# Patient Record
Sex: Female | Born: 1985 | Race: White | Hispanic: No | Marital: Married | Smoking: Never smoker
Health system: Southern US, Community
[De-identification: ages and names within clinical notes are randomized; demographics above are authoritative.]

## PROBLEM LIST (undated history)

## (undated) ENCOUNTER — Inpatient Hospital Stay (HOSPITAL_COMMUNITY): Payer: Self-pay

## (undated) DIAGNOSIS — Z803 Family history of malignant neoplasm of breast: Secondary | ICD-10-CM

## (undated) DIAGNOSIS — G43009 Migraine without aura, not intractable, without status migrainosus: Secondary | ICD-10-CM

## (undated) DIAGNOSIS — J4530 Mild persistent asthma, uncomplicated: Secondary | ICD-10-CM

## (undated) DIAGNOSIS — F411 Generalized anxiety disorder: Secondary | ICD-10-CM

## (undated) HISTORY — DX: Migraine without aura, not intractable, without status migrainosus: G43.009

## (undated) HISTORY — DX: Generalized anxiety disorder: F41.1

## (undated) HISTORY — DX: Family history of malignant neoplasm of breast: Z80.3

## (undated) HISTORY — PX: WISDOM TOOTH EXTRACTION: SHX21

## (undated) HISTORY — PX: NO PAST SURGERIES: SHX2092

## (undated) HISTORY — DX: Mild persistent asthma, uncomplicated: J45.30

---
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
## 2007-06-29 ENCOUNTER — Emergency Department (HOSPITAL_COMMUNITY): Admission: EM | Admit: 2007-06-29 | Discharge: 2007-06-29 | Payer: Self-pay | Admitting: Emergency Medicine

## 2009-07-23 ENCOUNTER — Emergency Department (HOSPITAL_COMMUNITY): Admission: EM | Admit: 2009-07-23 | Discharge: 2009-07-23 | Payer: Self-pay | Admitting: Emergency Medicine

## 2009-07-23 ENCOUNTER — Emergency Department (HOSPITAL_COMMUNITY): Admission: EM | Admit: 2009-07-23 | Discharge: 2009-07-23 | Payer: Self-pay | Admitting: Family Medicine

## 2010-01-21 ENCOUNTER — Emergency Department (HOSPITAL_COMMUNITY)
Admission: EM | Admit: 2010-01-21 | Discharge: 2010-01-21 | Payer: Self-pay | Source: Home / Self Care | Admitting: Emergency Medicine

## 2010-06-14 LAB — CBC
MCHC: 32.7 g/dL (ref 30.0–36.0)
Platelets: 284 10*3/uL (ref 150–400)
RBC: 4.65 MIL/uL (ref 3.87–5.11)
RDW: 13.4 % (ref 11.5–15.5)

## 2010-06-14 LAB — POCT URINALYSIS DIP (DEVICE)
Glucose, UA: NEGATIVE mg/dL
Specific Gravity, Urine: >=1.03 (ref 1.005–1.030)
Urobilinogen, UA: 0.2 mg/dL (ref 0.0–1.0)

## 2010-06-14 LAB — DIFFERENTIAL
Basophils Absolute: 0.1 10*3/uL (ref 0.0–0.1)
Basophils Relative: 1 % (ref 0–1)
Eosinophils Absolute: 0.1 10*3/uL (ref 0.0–0.7)
Neutro Abs: 4.3 10*3/uL (ref 1.7–7.7)
Neutrophils Relative %: 58 % (ref 43–77)

## 2010-06-14 LAB — POCT PREGNANCY, URINE: Preg Test, Ur: NEGATIVE

## 2010-06-14 LAB — URINALYSIS, ROUTINE W REFLEX MICROSCOPIC
Ketones, ur: NEGATIVE mg/dL
Nitrite: NEGATIVE
Protein, ur: NEGATIVE mg/dL
Urobilinogen, UA: 0.2 mg/dL (ref 0.0–1.0)
pH: 7 (ref 5.0–8.0)

## 2010-06-14 LAB — GC/CHLAMYDIA PROBE AMP, GENITAL: Chlamydia, DNA Probe: NEGATIVE

## 2010-06-14 LAB — WET PREP, GENITAL: Yeast Wet Prep HPF POC: NONE SEEN

## 2010-08-23 ENCOUNTER — Inpatient Hospital Stay (INDEPENDENT_AMBULATORY_CARE_PROVIDER_SITE_OTHER)
Admission: RE | Admit: 2010-08-23 | Discharge: 2010-08-23 | Disposition: A | Discharge status: Home / Self Care | Payer: 59 | Source: Ambulatory Visit | Attending: Emergency Medicine | Admitting: Emergency Medicine

## 2010-08-23 ENCOUNTER — Ambulatory Visit (INDEPENDENT_AMBULATORY_CARE_PROVIDER_SITE_OTHER): Payer: 59

## 2010-08-23 DIAGNOSIS — R0609 Other forms of dyspnea: Secondary | ICD-10-CM

## 2010-08-23 DIAGNOSIS — F411 Generalized anxiety disorder: Secondary | ICD-10-CM

## 2011-07-28 ENCOUNTER — Ambulatory Visit: Payer: 59 | Admitting: Family Medicine

## 2011-07-31 ENCOUNTER — Ambulatory Visit: Payer: 59 | Admitting: Family Medicine

## 2011-11-02 ENCOUNTER — Encounter: Payer: Self-pay | Admitting: Family Medicine

## 2011-11-02 ENCOUNTER — Ambulatory Visit (INDEPENDENT_AMBULATORY_CARE_PROVIDER_SITE_OTHER): Payer: 59 | Admitting: Family Medicine

## 2011-11-02 VITALS — BP 114/76 | HR 65 | Temp 97.2°F | Ht 69.25 in | Wt 126.0 lb

## 2011-11-02 DIAGNOSIS — G2581 Restless legs syndrome: Secondary | ICD-10-CM | POA: Insufficient documentation

## 2011-11-02 DIAGNOSIS — G43009 Migraine without aura, not intractable, without status migrainosus: Secondary | ICD-10-CM

## 2011-11-02 LAB — CBC WITH DIFFERENTIAL/PLATELET
Basophils Absolute: 0.1 10*3/uL (ref 0.0–0.1)
Basophils Relative: 1 % (ref 0.0–3.0)
Eosinophils Absolute: 0.4 10*3/uL (ref 0.0–0.7)
HCT: 39.8 % (ref 36.0–46.0)
Hemoglobin: 13 g/dL (ref 12.0–15.0)
Lymphs Abs: 2 10*3/uL (ref 0.7–4.0)
MCHC: 32.6 g/dL (ref 30.0–36.0)
Monocytes Relative: 9 % (ref 3.0–12.0)
Neutro Abs: 2.8 10*3/uL (ref 1.4–7.7)
RBC: 4.48 Mil/uL (ref 3.87–5.11)
RDW: 14.3 % (ref 11.5–14.6)

## 2011-11-02 LAB — IBC PANEL: Saturation Ratios: 7.4 % — ABNORMAL LOW (ref 20.0–50.0)

## 2011-11-02 LAB — FERRITIN: Ferritin: 9.7 ng/mL — ABNORMAL LOW (ref 10.0–291.0)

## 2011-11-02 MED ORDER — CLONAZEPAM 0.5 MG PO TABS: ORAL_TABLET | ORAL | Status: DC

## 2011-11-02 MED ORDER — RIZATRIPTAN BENZOATE 10 MG PO TABS: 10.0000 mg | ORAL_TABLET | ORAL | Status: DC | PRN

## 2011-11-02 NOTE — Progress Notes (Signed)
Office Note 11/02/2011  CC:  Chief Complaint  Patient presents with  . Establish Care    bilateral leg pain x 2 weeks, knee to ankle-usually shin , but can be all over, increased walking in flip flops recently    HPI:  Janet Hall is a 26 y.o. White female who is here to establish care and discuss leg pains. Patient's most recent primary MD: has GYN at [facility], otherwise no PCP. Old records were not reviewed prior to or during today's visit.  Last couple weeks, has bilat below the knees leg discomfort, not really a pain.  Can't keep legs still when she tries to go to sleep.  Aggravating, tingly feeling in LE's, causes probs with sleep initiation.  This prob has not bothered her before. No recent injury or overactivity.  No change in appearance of legs. Describes her menses as being very heavy: 3 pad or tampons per day x 5d, +clots.  This has worsened over the last few years.  Cycles are regular, q28d, and she is currently menstruating now--no missed periods. Denies hx of anemia.  We discussed her HA's today as well, says they do bother her almost daily, but only has a severe HA with nausea and photophobia anywhere from 1-4 times per month over the last few years.  She denies prodromal sx's or aura-like sx's.  She has tried excedrin migraine but nothing else, has never seen an MD for this.  She ends up having to lay down in a dark room and sleep for a while to get it to go away. She has been trying to cut back on caffeine lately and thinks this may be impacting her HA's lately, but otherwise she identifies no triggers, althought she admits to being a chronic worrier--"I stress and worry about EVERYTHING".  Denies depressed mood.  No hx of head trauma.   Past Medical History  Diagnosis Date  . Migraine without aura     Onset in early teens.  Anywhere from 2 per week to 1 per month.     PSH: none History reviewed. No pertinent past surgical history.  Family History  Problem  Relation Age of Onset  . Arthritis Mother   . Cancer Mother 64    breast  . Hypertension Father   . Arthritis Maternal Grandmother     liver cancer  . Arthritis Maternal Grandfather     stomach cancer    History   Social History  . Marital Status: Single    Spouse Name: N/A    Number of Children: N/A  . Years of Education: N/A   Occupational History  . Not on file.   Social History Main Topics  . Smoking status: Never Smoker   . Smokeless tobacco: Never Used  . Alcohol Use: No  . Drug Use: No  . Sexually Active: Yes   Other Topics Concern  . 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  No T/A/Ds.Intermittent exercising.   MEDS: none  No Known Allergies  ROS Review of Systems  Constitutional: Negative for fever and fatigue.  HENT: Negative for congestion and sore throat.   Eyes: Negative for visual disturbance.  Respiratory: Negative for cough.   Cardiovascular: Negative for chest pain.  Gastrointestinal: Negative for nausea and abdominal pain.  Genitourinary: Negative for dysuria. Menstrual problem: as per hpi.  Musculoskeletal: Negative for myalgias, back pain, joint swelling and arthralgias.  Skin: Negative for color change, pallor and rash.  Neurological: Negative for weakness and headaches.  Hematological: Negative for adenopathy. Does not bruise/bleed easily.    PE; Blood pressure 114/76, pulse 65, temperature 97.2 F (36.2 C), temperature source Temporal, height 5' 9.25" (1.759 m), weight 126 lb (57.153 kg), last menstrual period 10/08/2011, SpO2 100.00%. Gen: Alert, well appearing.  Patient is oriented to person, place, time, and situation. AFFECT: pleasant, lucid thought and speech. ENT: Eyes: no injection, icteris, swelling, or exudate.  EOMI, PERRLA. Nose: no drainage or turbinate edema/swelling.  No injection or focal lesion.  Mouth: lips without  lesion/swelling.  Oral mucosa pink and moist.  Dentition intact and without obvious caries or gingival swelling.  Oropharynx without erythema, exudate, or swelling.  Neck - No masses or thyromegaly or limitation in range of motion CV: RRR, no m/r/g.   LUNGS: CTA bilat, nonlabored resps, good aeration in all lung fields. EXT: no clubbing, cyanosis, or edema.  Lower legs without tenderness, bruising, hypesthesia, or color change.  DP and PT pulses 2+ bilat. Neuro: CN 2-12 intact bilaterally, strength 5/5 in proximal and distal upper extremities and lower extremities bilaterally.  No sensory deficits.  No tremor.  No ataxia.  Upper extremity and lower extremity DTRs symmetric.   Pertinent labs:  None today  ASSESSMENT AND PLAN:   New pt: obtain old records (GYN).  Restless legs syndrome Describes classic RLS sx's. Will check for iron def--CBC, IBC panel, ferritin today. Discussed med options,decided on trial of clonazepam 0.5mg , 1-2 qhs.  Therapeutic expectations and side effect profile of medication discussed today.  Patient's questions answered. F/u 2-3 wks.  Migraine headache without aura Has never tried migraine-specific abortive med or prophylactic migraine med. I rx'd maxalt generic 10mg  for her today to try prn onset of HA.  Therapeutic expectations and side effect profile of medication discussed today.  Patient's questions answered. She admits to being chronically stressed/big worrier about everything, and this is likely her main trigger for HA's. We hopefully will get to discuss mgmt of this at next f/u.    Return for f/u 2-3 wks to recheck RLS, migraine, discuss anxiety.

## 2011-11-02 NOTE — Assessment & Plan Note (Signed)
Has never tried migraine-specific abortive med or prophylactic migraine med. I rx'd maxalt generic 10mg  for her today to try prn onset of HA.  Therapeutic expectations and side effect profile of medication discussed today.  Patient's questions answered. She admits to being chronically stressed/big worrier about everything, and this is likely her main trigger for HA's. We hopefully will get to discuss mgmt of this at next f/u.

## 2011-11-02 NOTE — Assessment & Plan Note (Signed)
Describes classic RLS sx's. Will check for iron def--CBC, IBC panel, ferritin today. Discussed med options,decided on trial of clonazepam 0.5mg , 1-2 qhs.  Therapeutic expectations and side effect profile of medication discussed today.  Patient's questions answered. F/u 2-3 wks.

## 2011-11-16 ENCOUNTER — Telehealth: Payer: Self-pay | Admitting: Family Medicine

## 2011-11-16 NOTE — Telephone Encounter (Signed)
Pt states that she has been urinating the bed since she has been on the Clonazepam for RLS. Wants to know if this is a side effect is this normal or does a new script need to be phoned in? Pt uses [facility].

## 2011-11-16 NOTE — Telephone Encounter (Signed)
The dose of clonazepam may be a little too high, although this is a very low dose.  Please see if she is taking 1 tab or two tabs and also see if this med is helping her RLS symptoms or not.  With this info I can then decide what to do next--thx

## 2011-11-17 MED ORDER — ROPINIROLE HCL 0.25 MG PO TABS: ORAL_TABLET | ORAL | Status: DC

## 2011-11-17 NOTE — Telephone Encounter (Signed)
Pt notified.  She will call me back to let me know what pharmacy to send to as [facility] pharmacy will be closed tomorrow.

## 2011-11-17 NOTE — Telephone Encounter (Signed)
Pt is taking one tablet daily and it is helping her RLS symptoms.  She also states she began having crazy, scary dreams after beginning med.  She has also noticed an increase in her HA's since starting this med.  She is having a HA almost everyday.

## 2011-11-17 NOTE — Telephone Encounter (Signed)
RX faxed to [facility] per pt request.

## 2011-11-17 NOTE — Telephone Encounter (Signed)
Stop clonazepam. I sent in rx for ropinirole: tell her to follow the instructions on the bottle.  Remind her that she should be taking her iron tab and that when her iron levels are back to normal she should be able to come off of the RLS meds.--thx

## 2011-11-17 NOTE — Telephone Encounter (Signed)
Janet Hall- this is the patient that called yesterday. Not sure if you called her or not. I am forwarding this to you.

## 2011-11-21 ENCOUNTER — Ambulatory Visit (INDEPENDENT_AMBULATORY_CARE_PROVIDER_SITE_OTHER): Payer: 59 | Admitting: Family Medicine

## 2011-11-21 ENCOUNTER — Encounter: Payer: Self-pay | Admitting: Family Medicine

## 2011-11-21 VITALS — BP 108/73 | HR 58 | Ht 69.25 in | Wt 125.0 lb

## 2011-11-21 DIAGNOSIS — G2581 Restless legs syndrome: Secondary | ICD-10-CM

## 2011-11-21 DIAGNOSIS — Z124 Encounter for screening for malignant neoplasm of cervix: Secondary | ICD-10-CM | POA: Insufficient documentation

## 2011-11-21 DIAGNOSIS — F411 Generalized anxiety disorder: Secondary | ICD-10-CM

## 2011-11-21 DIAGNOSIS — G43009 Migraine without aura, not intractable, without status migrainosus: Secondary | ICD-10-CM

## 2011-11-21 HISTORY — DX: Generalized anxiety disorder: F41.1

## 2011-11-21 NOTE — Progress Notes (Signed)
OFFICE NOTE  11/21/2011  CC:  Chief Complaint  Patient presents with  . Follow-up    migraines-better off clonazepam; RLS-change to Requip on Sunday, doing OK     HPI: Patient is a 26 y.o. Caucasian female who is here for 3 wk f/u RLS, migraines, anxiety. Taking iron bid, tolerating it well. CLonazepam didn't help RLS + it oversedated her and made her HA's more frequent. Maxalt trial unhelpful--she says Goodies powders work every time for her when she has a HA. Worry, chronic anxiety was re-addressed today---she is not ready to start daily anxiety med at this time. Denies depression.  One time in distant past she went to ED for breathing issues and was eventually told she had anxiety problems, was given a few pills of something she doesn't recall the name of but says it was not helpful.  She is not interested in any med for anxiety that is potentially addictive/habit forming. She has never been on an SSRI.   Pertinent PMH:  Past Medical History  Diagnosis Date  . Migraine without aura     Onset in early teens.  Anywhere from 2 per week to 1 per month.      MEDS:  Outpatient Prescriptions Prior to Visit  Medication Sig Dispense Refill  . rizatriptan (MAXALT) 10 MG tablet Take 1 tablet (10 mg total) by mouth as needed for migraine. May repeat in 2 hours if needed (max in 24 h is 20 mg)  9 tablet  0  . rOPINIRole (REQUIP) 0.25 MG tablet 1 tab po 2 hours prior to sleep.  May increase by 1 tab every 5 days to maximum of 6 tabs po 2 hours prior to sleep to get desired control of restless legs syndrome  60 tablet  1    PE: Blood pressure 108/73, pulse 58, height 5' 9.25" (1.759 m), weight 125 lb (56.7 kg), last menstrual period 10/31/2011. Gen: Alert, well appearing.  Patient is oriented to person, place, time, and situation. No further exam today.  IMPRESSION AND PLAN:  Restless legs syndrome Improving. Continue ropinirole, titrate dose as directed prn. Continue iron  replacement (325mg  bid). Recheck iron levels in 5 wks (total of 2 mo on iron).  Migraine headache without aura Failed maxalt trial. She does fine with goodies powder and uses this only 1-4 times per month. Worry may be her primary trigger but she declined trial of SSRI today.  GAD (generalized anxiety disorder) Pt declined trial of SSRI.      FOLLOW UP: 5 wks

## 2011-11-21 NOTE — Assessment & Plan Note (Signed)
Improving. Continue ropinirole, titrate dose as directed prn. Continue iron replacement (325mg  bid). Recheck iron levels in 5 wks (total of 2 mo on iron).

## 2011-11-21 NOTE — Assessment & Plan Note (Signed)
Pt declined trial of SSRI.

## 2011-11-21 NOTE — Assessment & Plan Note (Signed)
Failed maxalt trial. She does fine with goodies powder and uses this only 1-4 times per month. Worry may be her primary trigger but she declined trial of SSRI today.

## 2011-12-27 ENCOUNTER — Ambulatory Visit (INDEPENDENT_AMBULATORY_CARE_PROVIDER_SITE_OTHER): Payer: 59 | Admitting: Family Medicine

## 2011-12-27 ENCOUNTER — Encounter: Payer: Self-pay | Admitting: Family Medicine

## 2011-12-27 VITALS — BP 132/83 | HR 80 | Ht 69.25 in | Wt 126.0 lb

## 2011-12-27 DIAGNOSIS — G2581 Restless legs syndrome: Secondary | ICD-10-CM

## 2011-12-27 DIAGNOSIS — S838X9A Sprain of other specified parts of unspecified knee, initial encounter: Secondary | ICD-10-CM

## 2011-12-27 DIAGNOSIS — E611 Iron deficiency: Secondary | ICD-10-CM

## 2011-12-27 DIAGNOSIS — S76119A Strain of unspecified quadriceps muscle, fascia and tendon, initial encounter: Secondary | ICD-10-CM

## 2011-12-27 LAB — IBC PANEL
Saturation Ratios: 45.1 % (ref 20.0–50.0)
Transferrin: 243.7 mg/dL (ref 212.0–360.0)

## 2011-12-27 MED ORDER — ROPINIROLE HCL 0.5 MG PO TABS: ORAL_TABLET | ORAL | Status: DC

## 2011-12-27 NOTE — Assessment & Plan Note (Signed)
Right quad strain, improving slowly with rest. Recommended heat qd-bid. ROM/stretching/strengthening. Thigh sleeve fitted today, given to pt in office to take home and wear daily until minimal sx's. She declines formal PT referral at this time.

## 2011-12-27 NOTE — Assessment & Plan Note (Signed)
Improving, but still needs ropinirole titrated up. Increase to 0.5mg  qhs dosing x 2 wks, then increase to 1mg  qhs dosing after that if still not well controlled. Recheck IBC and ferritin today. F/u in office in 45mo.  I am hoping that after her iron has been normal for a couple of months then she can be weened off the ropinirole.

## 2011-12-27 NOTE — Patient Instructions (Addendum)
Quadriceps Strain  with Rehab  A strain is a tear in a muscle or the tendon that attaches the muscle to bone. A quadriceps strain is a tear in the muscles on the front of the thigh (quadriceps muscles) or their tendons. The quadriceps muscles are important for straightening the knee and bending the hip. The condition is characterized by pain, inflammation, and reduced function of these muscles. Strains are classified into three categories. Grade 1 strains cause pain, but the tendon is not lengthened. Grade 2 strains include a lengthened ligament due to the ligament being stretched or partially ruptured. With grade 2 strains there is still function, although the function may be diminished. Grade 3 strains are characterized by a complete tear of the tendon or muscle, and function is usually impaired.   SYMPTOMS   · Pain, tenderness, inflammation, and/or bruising (contusion) over the quadriceps muscles  · Pain that worsens with use of the quadriceps muscles.  · Muscle spasm in the thigh.  · Difficulty with common tasks that involve the quadriceps muscle, such as walking.  · A crackling sound (crepitation) when the tendon is moved or touched.  · Loss of fullness of the muscle or bulging within the area of muscle with complete rupture.  CAUSES   A strain occurs when a force is placed on the muscle or tendon that is greater than it can withstand. Common mechanisms of injury include:  · Repetitive strenuous use of the quadriceps muscles. This may be due to an increase in the intensity, frequency, or duration of exercise.  · Direct trauma to the quadriceps muscles or tendons.  RISK INCREASES WITH:  · Activities that involve forceful contractions of the quadriceps muscles (jumping or sprinting).  · Contact sports (soccer or football).  · Poor strength and flexibility.  · Failure to warm-up properly before activity.  · Previous injury to the thigh or knee.  PREVENTION  · Warm up and stretch properly before activity.  · Allow  for adequate recovery between workouts.  · Maintain physical fitness:  · Strength, flexibility, and endurance.  · Cardiovascular fitness.  · Wear properly fitted and padded protective equipment.  PROGNOSIS   If treated properly, then quadriceps muscles strains are usually curable within 6 weeks.   RELATED COMPLICATIONS   · Prolonged healing time, if improperly treated or re-injured.  · Recurrent symptoms that result in a chronic problem.  · Recurrence of symptoms if activity is resumed too soon.  TREATMENT   Treatment initially involves the use of ice and medication to help reduce pain and inflammation. The use of strengthening and stretching exercises may help reduce pain with activity. These exercises may be performed at home or with referral to a therapist. Crutches may be recommended to allow the muscle to rest until walking can be completed without limping. Surgery is rarely necessary for this injury, but may be considered if the injury involves a grade 3 strain, or if symptoms persist for greater than 3 months despite non-surgical (conservative) treatment.   MEDICATION  · If pain medication is necessary, then nonsteroidal anti-inflammatory medications, such as aspirin and ibuprofen, or other minor pain relievers, such as acetaminophen, are often recommended.  · Do not take pain medication for 7 days before surgery.  · Prescription pain relievers may be given if deemed necessary by your caregiver. Use only as directed and only as much as you need.  · Ointments applied to the skin may be helpful.  · Corticosteroid injections may be given   by your caregiver. These injections should be reserved for the most serious cases, because they may only be given a certain number of times.  HEAT AND COLD  · Cold treatment (icing) relieves pain and reduces inflammation. Cold treatment should be applied for 10 to 15 minutes every 2 to 3 hours for inflammation and pain and immediately after any activity that aggravates your  symptoms. Use ice packs or massage the area with a piece of ice (ice massage).  · Heat treatment may be used prior to performing the stretching and strengthening activities prescribed by your caregiver, physical therapist, or athletic trainer. Use a heat pack or soak the injury in warm water.  SEEK MEDICAL CARE IF:  · Treatment seems to offer no benefit, or the condition worsens.  · Any medications produce adverse side effects.  EXERCISES   RANGE OF MOTION (ROM) AND STRETCHING EXERCISES - Quadriceps Strain  These exercises may help you when beginning to rehabilitate your injury. Your symptoms may resolve with or without further involvement from your physician, physical therapist or athletic trainer. While completing these exercises, remember:   · Restoring tissue flexibility helps normal motion to return to the joints. This allows healthier, less painful movement and activity.  · An effective stretch should be held for at least 30 seconds.  · A stretch should never be painful. You should only feel a gentle lengthening or release in the stretched tissue.  RANGE OF MOTION - Knee Flexion, Active  · Lie on your back with both knees straight. (If this causes back discomfort, bend your opposite knee, placing your foot flat on the floor.)  · Slowly slide your heel back toward your buttocks until you feel a gentle stretch in the front of your knee or thigh.  · Hold for __________ seconds. Slowly slide your heel back to the starting position.  Repeat __________ times. Complete this exercise __________ times per day.   STRETCH - Quadriceps, Prone  · Lie on your stomach on a firm surface, such as a bed or padded floor.  · Bend your right / left knee and grasp your ankle. If you are unable to reach, your ankle or pant leg, use a belt around your foot to lengthen your reach.  · Gently pull your heel toward your buttocks. Your knee should not slide out to the side. You should feel a stretch in the front of your thigh and/or  knee.  · Hold this position for __________ seconds.  Repeat __________ times. Complete this stretch __________ times per day.   STRETCHING - Hip Flexors, Lunge  · Half kneel with your right / left knee on the floor and your opposite knee bent and directly over your ankle.  · Keep good posture with your head over your shoulders. Tighten your buttocks to point your tailbone downward; this will prevent your back from arching too much.  · You should feel a gentle stretch in the front of your thigh and/or hip. If you do not feel any resistance, slightly slide your opposite foot forward and then slowly lunge forward so your knee once again lines up over your ankle. Be sure your tailbone remains pointed downward.  · Hold this stretch for __________ seconds.  Repeat __________ times. Complete this stretch __________ times per day.  STRENGTHENING EXERCISES - Quadriceps Strain  These exercises may help you when beginning to rehabilitate your injury. They may resolve your symptoms with or without further involvement from your physician, physical therapist or athletic trainer.   While completing these exercises, remember:   · Muscles can gain both the endurance and the strength needed for everyday activities through controlled exercises.  · Complete these exercises as instructed by your physician, physical therapist or athletic trainer. Progress the resistance and repetitions only as guided.  STRENGTH - Quadriceps, Isometrics  · Lie on your back with your right / left leg extended and your opposite knee bent.  · Gradually tense the muscles in the front of your right / left thigh. You should see either your knee cap slide up toward your hip or increased dimpling just above the knee. This motion will push the back of the knee down toward the floor/mat/bed on which you are lying.  · Hold the muscle as tight as you can without increasing your pain for __________ seconds.  · Relax the muscles slowly and completely in between each  repetition.  Repeat __________ times. Complete this exercise __________ times per day.   STRENGTH - Quadriceps, Short Arcs   · Lie on your back. Place a __________ inch towel roll under your knee so that the knee slightly bends.  · Raise only your lower leg by tightening the muscles in the front of your thigh. Do not allow your thigh to rise.  · Hold this position for __________ seconds.  Repeat __________ times. Complete this exercise __________ times per day.   OPTIONAL ANKLE WEIGHTS: Begin with ____________________, but DO NOT exceed ____________________. Increase in1 lb/0.5 kg increments.  STRENGTH - Quadriceps, Straight Leg Raises   Quality counts! Watch for signs that the quadriceps muscle is working to insure you are strengthening the correct muscles and not "cheating" by substituting with healthier muscles.  · Lay on your back with your right / left leg extended and your opposite knee bent.  · Tense the muscles in the front of your right / left thigh. You should see either your knee cap slide up or increased dimpling just above the knee. Your thigh may even quiver.  · Tighten these muscles even more and raise your leg 4 to 6 inches off the floor. Hold for __________ seconds.  · Keeping these muscles tense, lower your leg.  · Relax the muscles slowly and completely in between each repetition.  Repeat __________ times. Complete this exercise __________ times per day.   STRENGTH - Quadriceps, Wall Slides   Follow guidelines for form closely. Increased knee pain often results from poorly placed feet or knees.  · Lean against a smooth wall or door and walk your feet out 18-24 inches. Place your feet hip-width apart.  · Slowly slide down the wall or door until your knees bend __________ degrees.* Keep your knees over your heels, not your toes, and in line with your hips, not falling to either side.  · Hold for __________ seconds. Stand up to rest for __________ seconds in between each repetition.  Repeat  __________ times. Complete this exercise __________ times per day.  * Your physician, physical therapist or athletic trainer will alter this angle based on your symptoms and progress.  STRENGTH - Quadriceps, Step-Ups   · Use a thick book, step or step stool that is __________ inches tall.  · Holding a wall or counter for balance only, not support.  · Slowly step-up with your right / left foot, keeping your knee in line with your hip and foot. Do not allow your knee to bend so far that you cannot see your toes.  · Slowly unlock your knee and

## 2011-12-27 NOTE — Progress Notes (Signed)
OFFICE NOTE  12/27/2011  CC:  Chief Complaint  Patient presents with  . Follow-up    RLS-better on Requip, sleeping better; anemia; anxiety     HPI: Patient is a 26 y.o. Caucasian female who is here for 1 mo f/u iron deficiency without anemia and associated RLS. She has been on ropinirole for her RLS and was finding it more helpful than the clonazepam I had tried initially. Says 3 wks ago she strained her right quad muscle when running playing softball. It is improving some but she cannot run still w/out it hurting and impairing running. She takes no med for this.  It doesn't bother her at rest or when walking, just when running.  No back pain.  No paresthesias or LE weakness.   Pertinent PMH:  Past Medical History  Diagnosis Date  . Migraine without aura     Onset in early teens.  Anywhere from 2 per week to 1 per month.    . Migraine headache without aura 11/02/2011  . GAD (generalized anxiety disorder) 11/21/2011    MEDS:  Ropinirole 0.25mg  qhs, ferrous sulfate 325mg  bid  PE: Blood pressure 132/83, pulse 80, height 5' 9.25" (1.759 m), weight 126 lb (57.153 kg). Gen: Alert, well appearing.  Patient is oriented to person, place, time, and situation. AFFECT: pleasant, lucid thought and speech. Right thigh without TTP.  Hip flexion causes pain in right mid thigh.  Right lower leg extension causes no thigh pain.     IMPRESSION AND PLAN:  Restless legs syndrome Improving, but still needs ropinirole titrated up. Increase to 0.5mg  qhs dosing x 2 wks, then increase to 1mg  qhs dosing after that if still not well controlled. Recheck IBC and ferritin today. F/u in office in 7mo.  I am hoping that after her iron has been normal for a couple of months then she can be weened off the ropinirole.  Strain of quadriceps muscle Right quad strain, improving slowly with rest. Recommended heat qd-bid. ROM/stretching/strengthening. Thigh sleeve fitted today, given to pt in office to take  home and wear daily until minimal sx's. She declines formal PT referral at this time.     FOLLOW UP: 2 mo

## 2011-12-28 LAB — FERRITIN: Ferritin: 38.9 ng/mL (ref 10.0–291.0)

## 2012-01-12 ENCOUNTER — Ambulatory Visit (INDEPENDENT_AMBULATORY_CARE_PROVIDER_SITE_OTHER): Payer: 59 | Admitting: Family Medicine

## 2012-01-12 ENCOUNTER — Encounter: Payer: Self-pay | Admitting: Family Medicine

## 2012-01-12 VITALS — BP 106/74 | HR 62 | Temp 97.4°F | Ht 69.25 in | Wt 125.0 lb

## 2012-01-12 DIAGNOSIS — R071 Chest pain on breathing: Secondary | ICD-10-CM

## 2012-01-12 DIAGNOSIS — R079 Chest pain, unspecified: Secondary | ICD-10-CM

## 2012-01-12 DIAGNOSIS — R0789 Other chest pain: Secondary | ICD-10-CM

## 2012-01-12 DIAGNOSIS — R0602 Shortness of breath: Secondary | ICD-10-CM

## 2012-01-12 LAB — BASIC METABOLIC PANEL
BUN: 8 mg/dL (ref 6–23)
Chloride: 102 mEq/L (ref 96–112)
Creatinine, Ser: 0.8 mg/dL (ref 0.4–1.2)
GFR: 99.8 mL/min (ref >60.00)

## 2012-01-12 LAB — D-DIMER, QUANTITATIVE: D-Dimer, Quant: 0.27 ug/mL-FEU (ref 0.00–0.48)

## 2012-01-12 NOTE — Progress Notes (Signed)
OFFICE NOTE  01/12/2012  CC:  Chief Complaint  Patient presents with  . Shoulder Pain    left, feels like collar bone is sticking out more than right side, plays softball and throws left-handed; also having difficulty taking a deep breath, ? asthma  . Flu Vaccine    declined     HPI: Patient is a 26 y.o. Caucasian female who is here for several complaints.   Describes aggravating feeling in left upper chest region for the last couple of weeks.  Plays softball and is left handed, thinks clavicle is poking out on left.   Pain not really present upon awakening but notes it more as day goes on.   No hx of injury to shoulder/clavicle/chest wall. Appetite down.  Feels like breathing is different, has to take deep breaths more frequently.  No wheezing or rapid breathing.  No fevers.  NO leg swelling or pain.   No prolonged immobility, no FH of blood clots. Admits to a lot of belching lately, but denies GERD.  Pertinent PMH:  Past Medical History  Diagnosis Date  . Migraine without aura     Onset in early teens.  Anywhere from 2 per week to 1 per month.    . Migraine headache without aura 11/02/2011  . GAD (generalized anxiety disorder) 11/21/2011    MEDS:  Outpatient Prescriptions Prior to Visit  Medication Sig Dispense Refill  . ferrous sulfate 325 (65 FE) MG EC tablet Take 325 mg by mouth 2 (two) times daily.      Marland Kitchen rOPINIRole (REQUIP) 0.5 MG tablet 1 tab po 2 hours prior to bedtime.  After 1 week, you may take 2 tabs 2 hours prior to bedtime if you don't feel like your RLS is improved.  30 tablet  1    PE: Blood pressure 106/74, pulse 62, temperature 97.4 F (36.3 C), temperature source Temporal, height 5' 9.25" (1.759 m), weight 125 lb (56.7 kg), SpO2 97.00%. Examined pt with CMA Francee Piccolo in room as chaperone. Gen: Alert, well appearing.  Patient is oriented to person, place, time, and situation. AFFECT: pleasant, lucid thought and speech.   ENT:   Eyes: no injection,  icteris, swelling, or exudate.  EOMI, PERRLA. Nose: no drainage or turbinate edema/swelling.  No injection or focal lesion.  Mouth: lips without lesion/swelling.  Oral mucosa pink and moist.  Dentition intact and without obvious caries or gingival swelling.  Oropharynx without erythema, exudate, or swelling.  Neck - No masses or thyromegaly or limitation in range of motion CV: RRR, no m/r/g.   LUNGS: CTA bilat, nonlabored resps, good aeration in all lung fields. EXT: no clubbing, cyanosis, or edema.  Musc: clavicles symmetric, normal appearing, nontender.  Mild soreness to palpation of pectoralis mm's just below left clavicle diffusely.  No supraclavicular or axillary LAD.  IMPRESSION AND PLAN:  Left-sided chest wall pain I think she has upper pectoralis muscle strain/pain secondary to softball--throws left-handed. Her breathing complaint is highly suggestive of anxiety/stress.   I think she is low risk for PE but will check D-dimer stat.  If neg, no further w/u for this.  If positive, will pursue chest CT.    An After Visit Summary was printed and given to the patient.  FOLLOW UP: prn

## 2012-01-12 NOTE — Assessment & Plan Note (Addendum)
I think she has upper pectoralis muscle strain/pain secondary to softball--throws left-handed. Her breathing complaint is highly suggestive of anxiety/stress.   I think she is low risk for PE but will check D-dimer stat.  If neg, no further w/u for this.  If positive, will pursue chest CT.

## 2012-02-26 ENCOUNTER — Ambulatory Visit: Payer: 59 | Admitting: Family Medicine

## 2012-03-14 ENCOUNTER — Ambulatory Visit (INDEPENDENT_AMBULATORY_CARE_PROVIDER_SITE_OTHER): Payer: 59 | Admitting: Family Medicine

## 2012-03-14 ENCOUNTER — Encounter: Payer: Self-pay | Admitting: Family Medicine

## 2012-03-14 VITALS — BP 128/83 | HR 76 | Temp 98.4°F | Ht 69.25 in | Wt 126.0 lb

## 2012-03-14 DIAGNOSIS — F411 Generalized anxiety disorder: Secondary | ICD-10-CM

## 2012-03-14 DIAGNOSIS — G2581 Restless legs syndrome: Secondary | ICD-10-CM

## 2012-03-14 DIAGNOSIS — R079 Chest pain, unspecified: Secondary | ICD-10-CM

## 2012-03-14 MED ORDER — CITALOPRAM HYDROBROMIDE 20 MG PO TABS: 20.0000 mg | ORAL_TABLET | Freq: Every day | ORAL | Status: DC

## 2012-03-14 NOTE — Patient Instructions (Signed)
Ibuprofen: 3 of the 200mg  OTC tabs each dose.  Dose this morning, mid afternoon, and late evening.  Take with snack or food in stomach.

## 2012-03-14 NOTE — Progress Notes (Signed)
OFFICE NOTE  03/14/2012  CC:  Chief Complaint  Patient presents with  . Chest Pain    X 2-3 days- mom just found out her breast cancer returned and its stage 4     HPI: Patient is a 26 y.o. Caucasian female who is here for chest pains.  Onset about 3d/a. Sharp pain focally in middle of sternum, tender when she touches it, it comes and goes.  No known trigger. No alleviating factors.  No diaphoresis, no nausea, no SOB, no palpitations. She is taking her iron bid during menses and only occasionally when not menstruating--has trouble remembering to take it.  No recent acute viral illnesses Her RLS is gone since taking iron.  . She no longer works at [employer] and body works b/c her hours were cut back, so she is currently looking for another job.  She recently has been dealing with the diagnosis of stage 4 breast cancer in her mother.  Pertinent PMH:  Past Medical History  Diagnosis Date  . Migraine without aura     Onset in early teens.  Anywhere from 2 per week to 1 per month.    . Migraine headache without aura 11/02/2011  . GAD (generalized anxiety disorder) 11/21/2011   Past surgical, social, and family history reviewed and no changes noted since last office visit.  MEDS:  Outpatient Prescriptions Prior to Visit  Medication Sig Dispense Refill  . ferrous sulfate 325 (65 FE) MG EC tablet Take 325 mg by mouth 2 (two) times daily.      . [DISCONTINUED] rOPINIRole (REQUIP) 0.5 MG tablet 1 tab po 2 hours prior to bedtime.  After 1 week, you may take 2 tabs 2 hours prior to bedtime if you don't feel like your RLS is improved.  30 tablet  1   Last reviewed on 03/14/2012  1:39 PM by Jeoffrey Massed, MD  PE:  PT examined with CMA Francee Piccolo present. Blood pressure 128/83, pulse 76, temperature 98.4 F (36.9 C), temperature source Temporal, height 5' 9.25" (1.759 m), weight 126 lb (57.153 kg), last menstrual period 02/28/2012, SpO2 98.00%. Gen: Alert, well appearing.  Patient is  oriented to person, place, time, and situation. Affect: pleasant, slightly anxious. ENT:  Eyes: no injection, icteris, swelling, or exudate.  EOMI, PERRLA. Nose: no drainage or turbinate edema/swelling.  No injection or focal lesion.  Mouth: lips without lesion/swelling.  Oral mucosa pink and moist.  Dentition intact and without obvious caries or gingival swelling.  Oropharynx without erythema, exudate, or swelling.  Neck - No masses or thyromegaly or limitation in range of motion CV: RRR, no m/r/g.   LUNGS: CTA bilat, nonlabored resps, good aeration in all lung fields. Chest: focal TTP in a quarter -sized area on the mid sternal region.  No mass, no erythema.  No costochondral tenderness.    LABS: none  IMPRESSION AND PLAN:  Central chest pain No sign of cardiopulmonary etiology. Suspect musculoskeletal pain + stress/anxiety.   She does admit to a moderate amount of hypochondriac tendencies. Reassured pt, recommended ibuprofen 600mg  tid with food for 2 wks, then use prn.  GAD (generalized anxiety disorder) She decided to do a trial of SSRI today: started citalopram 20mg  qd.  Therapeutic expectations and side effect profile of medication discussed today.  Patient's questions answered. She is very hesitant to take any benzos.  Restless legs syndrome Resolved with iron replacement. Continue FeSO4 325 bid during menses, and daily during other times of the month.   An  After Visit Summary was printed and given to the patient.  Will check CBC and iron studies at this visit.  FOLLOW UP: 1 month

## 2012-03-15 DIAGNOSIS — R079 Chest pain, unspecified: Secondary | ICD-10-CM | POA: Insufficient documentation

## 2012-03-15 NOTE — Assessment & Plan Note (Signed)
Resolved with iron replacement. Continue FeSO4 325 bid during menses, and daily during other times of the month.

## 2012-03-15 NOTE — Assessment & Plan Note (Signed)
No sign of cardiopulmonary etiology. Suspect musculoskeletal pain + stress/anxiety.   She does admit to a moderate amount of hypochondriac tendencies. Reassured pt, recommended ibuprofen 600mg  tid with food for 2 wks, then use prn.

## 2012-03-15 NOTE — Assessment & Plan Note (Signed)
She decided to do a trial of SSRI today: started citalopram 20mg  qd.  Therapeutic expectations and side effect profile of medication discussed today.  Patient's questions answered. She is very hesitant to take any benzos.

## 2012-04-18 ENCOUNTER — Encounter: Payer: Self-pay | Admitting: Family Medicine

## 2012-04-18 ENCOUNTER — Ambulatory Visit (INDEPENDENT_AMBULATORY_CARE_PROVIDER_SITE_OTHER): Payer: 59 | Admitting: Family Medicine

## 2012-04-18 VITALS — BP 118/83 | HR 68 | Ht 69.25 in | Wt 126.0 lb

## 2012-04-18 DIAGNOSIS — F411 Generalized anxiety disorder: Secondary | ICD-10-CM

## 2012-04-18 DIAGNOSIS — R202 Paresthesia of skin: Secondary | ICD-10-CM | POA: Insufficient documentation

## 2012-04-18 DIAGNOSIS — R209 Unspecified disturbances of skin sensation: Secondary | ICD-10-CM

## 2012-04-18 NOTE — Assessment & Plan Note (Signed)
Encouraged pt to START CITALOPRAM as planned last visit.

## 2012-04-18 NOTE — Progress Notes (Signed)
OFFICE NOTE  04/18/2012  CC:  Chief Complaint  Patient presents with  . Follow-up    anxiety     HPI: Patient is a 27 y.o. Caucasian female who is here for f/u GAD and also having some left shoulder and arm pain.  Didn't start the citalopram I rx'd last time b/c "too busy"--her mom has stage 4 breast cancer and she has been going with her to lots of appointments.  She states her generalized anxiety is not as bad as it was a month ago but is still prominent.    Onset 1 wk ago: left shoulder pain and goes down left arm, also some tingling sensation.  Coming and going initially, was worse in middle of night or morning, lately hurting more constantly.  Intensity 5-6/10, but when asked about intensity she stated the sensation was hard to characterize and was not really a bad pain but just an uncomfortable feeling in the shoulder and arm.  She can't recall any repetitive motion or strain to bring this on.  Mild left sided neck pain.  No chest pain.  Denies weakness in left arm or hand.     Pertinent PMH:  Past Medical History  Diagnosis Date  . Migraine without aura     Onset in early teens.  Anywhere from 2 per week to 1 per month.    . Migraine headache without aura 11/02/2011  . GAD (generalized anxiety disorder) 11/21/2011   Past surgical, social, and family history reviewed and no changes noted since last office visit.  MEDS:  Outpatient Prescriptions Prior to Visit  Medication Sig Dispense Refill  . ferrous sulfate 325 (65 FE) MG EC tablet Take 325 mg by mouth 2 (two) times daily.      . citalopram (CELEXA) 20 MG tablet Take 1 tablet (20 mg total) by mouth daily.  30 tablet  0  Last reviewed on 04/18/2012  1:11 PM by Jeoffrey Massed, MD  PE: Blood pressure 118/83, pulse 68, height 5' 9.25" (1.759 m), weight 126 lb (57.153 kg), last menstrual period 03/29/2012. Gen: Alert, well appearing.  Patient is oriented to person, place, time, and situation. AFFECT: pleasant, lucid thought and  speech. Neck: no significant TTP.  ROM fully intact.  Spurling's test negative. Left shoulder has normal ROM, without pain or stiffness.  She has very minimal tenderness to palpation over the superior-most portion of the trapezius region on the left.  All rotator cuff testing was normal.   No tenderness of AC joint or peri-acromion region.  Neuro: CN 2-12 intact bilaterally, strength 5/5 in proximal and distal upper extremities and lower extremities bilaterally.  No sensory deficits.  No tremor.  No disdiadochokinesis.  No ataxia.  Upper extremity and lower extremity DTRs symmetric.  No pronator drift.   IMPRESSION AND PLAN:  Paresthesia of left arm Her complaint is a bit vague, not convincing pain but more convincing for a paresthesia-type sensation. Etiology unclear to me.  No sign of cervical disc dz/nerve compression on exam. Considered imaging but feel she would be better served by a neurologist consult, so I've ordered this today. No med changes today.  GAD (generalized anxiety disorder) Encouraged pt to START CITALOPRAM as planned last visit.  An After Visit Summary was printed and given to the patient.  FOLLOW UP: 4mo, at which time we'll recheck iron levels.

## 2012-04-18 NOTE — Assessment & Plan Note (Signed)
Her complaint is a bit vague, not convincing pain but more convincing for a paresthesia-type sensation. Etiology unclear to me.  No sign of cervical disc dz/nerve compression on exam. Considered imaging but feel she would be better served by a neurologist consult, so I've ordered this today. No med changes today.

## 2012-04-19 ENCOUNTER — Ambulatory Visit: Payer: 59 | Admitting: Family Medicine

## 2012-05-21 ENCOUNTER — Ambulatory Visit: Payer: 59 | Admitting: Family Medicine

## 2012-05-23 ENCOUNTER — Ambulatory Visit: Payer: 59 | Admitting: Family Medicine

## 2012-05-28 ENCOUNTER — Ambulatory Visit: Payer: 59 | Admitting: Family Medicine

## 2012-06-04 ENCOUNTER — Ambulatory Visit (INDEPENDENT_AMBULATORY_CARE_PROVIDER_SITE_OTHER): Payer: 59 | Admitting: Family Medicine

## 2012-06-04 ENCOUNTER — Encounter: Payer: Self-pay | Admitting: Family Medicine

## 2012-06-04 VITALS — BP 112/72 | HR 80 | Ht 69.25 in | Wt 120.0 lb

## 2012-06-04 DIAGNOSIS — R202 Paresthesia of skin: Secondary | ICD-10-CM

## 2012-06-04 DIAGNOSIS — R0602 Shortness of breath: Secondary | ICD-10-CM

## 2012-06-04 DIAGNOSIS — R209 Unspecified disturbances of skin sensation: Secondary | ICD-10-CM

## 2012-06-04 DIAGNOSIS — F411 Generalized anxiety disorder: Secondary | ICD-10-CM

## 2012-06-04 DIAGNOSIS — G2581 Restless legs syndrome: Secondary | ICD-10-CM

## 2012-06-04 DIAGNOSIS — J45909 Unspecified asthma, uncomplicated: Secondary | ICD-10-CM

## 2012-06-04 MED ORDER — CLONAZEPAM 1 MG PO TABS: ORAL_TABLET | ORAL | Status: DC

## 2012-06-04 MED ORDER — CITALOPRAM HYDROBROMIDE 20 MG PO TABS: 20.0000 mg | ORAL_TABLET | Freq: Every day | ORAL | Status: DC

## 2012-06-04 MED ORDER — ALBUTEROL SULFATE HFA 108 (90 BASE) MCG/ACT IN AERS: 2.0000 | INHALATION_SPRAY | RESPIRATORY_TRACT | Status: DC | PRN

## 2012-06-04 NOTE — Progress Notes (Signed)
OFFICE NOTE  06/04/2012  CC:  Chief Complaint  Patient presents with  . Follow-up    GAD     HPI: Patient is a 27 y.o. Caucasian female who is here for 6 wk f/u GAD.   Says paresthesia feeling in arm has completely resolved so she has decided to cancel neuro consult. Doing ok regarding anxiety, says main complaint is chronic feeling of SOB.   She belches a lot.  Says a feeling of tightness in chest comes and goes.  No coughing.   Fresh air helps some.  Running definitely makes it worse.   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  One short haired dog lives in her home.  Heat in her home is a Teacher, adult education. No hx of allergic rhinitis or eczema. We have evaluated this symptom in her before--more in the context of severe anxiety--and a CXR in 2012 showed all normal except mild hyperinflation, and a D-dimer was normal.  Pertinent PMH:  Past Medical History  Diagnosis Date  . Migraine without aura     Onset in early teens.  Anywhere from 2 per week to 1 per month.    . Migraine headache without aura 11/02/2011  . GAD (generalized anxiety disorder) 11/21/2011   History reviewed. No pertinent past surgical history. FH: mom with asthma, maternal GF with asthma  History   Social History  . Marital Status: Single    Spouse Name: N/A    Number of Children: N/A  . Years of Education: N/A   Occupational History  . Not on file.   Social History Main Topics  . Smoking status: Never Smoker   . Smokeless tobacco: Never Used  . Alcohol Use: No  . Drug Use: No  . Sexually Active: Yes   Other Topics Concern  . Not on file   Social History Narrative   Single, has boyfriend, no children.   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     No T/A/Ds.   Intermittent exercising.    MEDS:  Outpatient Prescriptions Prior to Visit  Medication Sig Dispense Refill  . citalopram (CELEXA) 20 MG tablet Take 1 tablet (20 mg total) by mouth daily.  30 tablet  0  .  ferrous sulfate 325 (65 FE) MG EC tablet Take 325 mg by mouth 2 (two) times daily.       No facility-administered medications prior to visit.    PE: Blood pressure 112/72, pulse 80, height 5' 9.25" (1.759 m), weight 120 lb (54.432 kg). Gen: Alert, well appearing, very thin.  She sits with hunched posture.  Patient is oriented to person, place, time, and situation. AFFECT: pleasant, lucid thought and speech. UJW:JXBJ: no injection, icteris, swelling, or exudate.  EOMI, PERRLA. Nose: no drainage or turbinate edema/swelling.  No injection or focal lesion.  Mouth: lips without lesion/swelling.  Oral mucosa pink and moist. Oropharynx without erythema, exudate, or swelling.  Neck - No masses or thyromegaly or limitation in range of motion CV: RRR, no m/g/r LUNGS: trace end exp wheeze heard in left anterior region but then this cleared upon recheck  Mild supraclavicular retractions with deep/forceful breaths, question of mild abd mm use for breathing.  Nonlabored resps. No tachypnea.    LAB: office spirometry today showed mild obstruction.  IMPRESSION AND PLAN:  GAD (generalized anxiety disorder) Improving some. Continue citalopram 20mg  qd.  Add clonazepam 1mg , 1/2-1 tab po bid prn. Therapeutic expectations and side effect profile  of medication discussed today.  Patient's questions answered.   Paresthesia of left arm Resolved.   Restless legs syndrome Likely secondary to iron def. She has been replacing iron: will recheck iron/cbc next f/u to see if she can come off of iron.  Asthma, chronic She had decent response to alb/atr neb in office today. Ventolin HFA rx'd, nurse did inhaler education. We'll see how much she needs this med and go from there to decide upon controller therapy or not. F/u 2 wks.   An After Visit Summary was printed and given to the patient.  FOLLOW UP: 2 wks

## 2012-06-05 DIAGNOSIS — J45909 Unspecified asthma, uncomplicated: Secondary | ICD-10-CM | POA: Insufficient documentation

## 2012-06-05 NOTE — Assessment & Plan Note (Signed)
Likely secondary to iron def. She has been replacing iron: will recheck iron/cbc next f/u to see if she can come off of iron.

## 2012-06-05 NOTE — Assessment & Plan Note (Signed)
Resolved

## 2012-06-05 NOTE — Assessment & Plan Note (Signed)
Improving some. Continue citalopram 20mg  qd.  Add clonazepam 1mg , 1/2-1 tab po bid prn. Therapeutic expectations and side effect profile of medication discussed today.  Patient's questions answered.

## 2012-06-05 NOTE — Assessment & Plan Note (Signed)
She had decent response to alb/atr neb in office today. Ventolin HFA rx'd, nurse did inhaler education. We'll see how much she needs this med and go from there to decide upon controller therapy or not. F/u 2 wks.

## 2012-06-19 ENCOUNTER — Encounter: Payer: Self-pay | Admitting: Family Medicine

## 2012-06-19 ENCOUNTER — Ambulatory Visit (INDEPENDENT_AMBULATORY_CARE_PROVIDER_SITE_OTHER): Payer: 59 | Admitting: Family Medicine

## 2012-06-19 VITALS — BP 120/76 | HR 62 | Temp 97.8°F | Resp 12 | Wt 119.5 lb

## 2012-06-19 DIAGNOSIS — J45909 Unspecified asthma, uncomplicated: Secondary | ICD-10-CM

## 2012-06-19 DIAGNOSIS — Z862 Personal history of diseases of the blood and blood-forming organs and certain disorders involving the immune mechanism: Secondary | ICD-10-CM

## 2012-06-19 DIAGNOSIS — D509 Iron deficiency anemia, unspecified: Secondary | ICD-10-CM

## 2012-06-19 LAB — FERRITIN: Ferritin: 26.5 ng/mL (ref 10.0–291.0)

## 2012-06-19 LAB — CBC WITH DIFFERENTIAL/PLATELET
Basophils Absolute: 0 10*3/uL (ref 0.0–0.1)
Eosinophils Relative: 6.9 % — ABNORMAL HIGH (ref 0.0–5.0)
HCT: 40.9 % (ref 36.0–46.0)
Lymphocytes Relative: 36.3 % (ref 12.0–46.0)
Monocytes Relative: 7.3 % (ref 3.0–12.0)
Neutrophils Relative %: 48.8 % (ref 43.0–77.0)
Platelets: 194 10*3/uL (ref 150.0–400.0)
RDW: 13.9 % (ref 11.5–14.6)
WBC: 5.2 10*3/uL (ref 4.5–10.5)

## 2012-06-19 LAB — IBC PANEL
Iron: 59 ug/dL (ref 42–145)
Saturation Ratios: 17.5 % — ABNORMAL LOW (ref 20.0–50.0)
Transferrin: 240.9 mg/dL (ref 212.0–360.0)

## 2012-06-19 NOTE — Progress Notes (Signed)
OFFICE NOTE  06/19/2012  CC:  Chief Complaint  Patient presents with  . Follow-up    Anxiety and asthma     HPI: Patient is a 27 y.o. Caucasian female who is here for 2 week f/u anxiety and suspicion of asthma.   Says she has only had to use her alb inhaler 3-4 times, most often in home environment around the wood heat.  Hx of iron def anemia that led to RLS.  RLS completely resolved after we got her on regular oral iron dosing. Has menorrhagia, says she has discussed possible OCP use with her GYN but has opted against this for now. She currently takes one iron tab daily except during her menses, at which time she takes 2 tabs qd.  Anxiety is stable.  Pertinent PMH:  Past Medical History  Diagnosis Date  . Migraine without aura     Onset in early teens.  Anywhere from 2 per week to 1 per month.    . Migraine headache without aura 11/02/2011  . 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 albuterol (VENTOLIN HFA) 108 (90 BASE) MCG/ACT inhaler Inhale 2 puffs into the lungs every 4 (four) hours as needed for wheezing.  1 Inhaler  0  . citalopram (CELEXA) 20 MG tablet Take 1 tablet (20 mg total) by mouth daily.  30 tablet  6  . clonazePAM (KLONOPIN) 1 MG tablet 1/2-1 tab po bid prn anxiety  60 tablet  1  . ferrous sulfate 325 (65 FE) MG EC tablet Take 325 mg by mouth 2 (two) times daily.       No facility-administered medications prior to visit.    PE: Blood pressure 120/76, pulse 62, temperature 97.8 F (36.6 C), temperature source Temporal, resp. rate 12, weight 119 lb 8 oz (54.205 kg), SpO2 99.00%. Gen: Alert, well appearing.  Patient is oriented to person, place, time, and situation. AFFECT: pleasant, lucid thought and speech. CV: RRR, no m/r/g.   LUNGS: CTA bilat, nonlabored resps, good aeration in all lung  fields.  IMPRESSION AND PLAN:  Asthma, chronic Intermittent vs mild persistent. We'll give her more time with just the rescue inhaler and see how much she is requiring it--f/u 36mo. Avoid triggers if possible.  History of iron deficiency anemia Secondary to menorrhagia, which is ongoing. Recheck CBC, IBC panel, ferritin today. If these are stable, will continue her on current iron regimen as long as her heavy menses continue. She says she has recently received notification from her GYN office that she is due for routine pap/pelvic, so she'll schedule this with them soon.   Anxiety: stable on citalopram and prn clonazepam.  FOLLOW UP: 36mo for f/u asthma

## 2012-06-19 NOTE — Assessment & Plan Note (Addendum)
Secondary to menorrhagia, which is ongoing. Recheck CBC, IBC panel, ferritin today. If these are stable, will continue her on current iron regimen as long as her heavy menses continue. She says she has recently received notification from her GYN office that she is due for routine pap/pelvic, so she'll schedule this with them soon.

## 2012-06-19 NOTE — Assessment & Plan Note (Signed)
Intermittent vs mild persistent. We'll give her more time with just the rescue inhaler and see how much she is requiring it--f/u 97mo. Avoid triggers if possible.

## 2012-08-16 ENCOUNTER — Ambulatory Visit: Payer: 59 | Admitting: Family Medicine

## 2012-08-20 ENCOUNTER — Encounter: Payer: Self-pay | Admitting: Family Medicine

## 2012-08-20 ENCOUNTER — Ambulatory Visit (INDEPENDENT_AMBULATORY_CARE_PROVIDER_SITE_OTHER): Payer: 59 | Admitting: Family Medicine

## 2012-08-20 VITALS — BP 102/69 | HR 87 | Temp 98.2°F | Resp 16 | Wt 119.8 lb

## 2012-08-20 DIAGNOSIS — J45909 Unspecified asthma, uncomplicated: Secondary | ICD-10-CM

## 2012-08-20 DIAGNOSIS — F411 Generalized anxiety disorder: Secondary | ICD-10-CM

## 2012-08-20 MED ORDER — CITALOPRAM HYDROBROMIDE 40 MG PO TABS: 40.0000 mg | ORAL_TABLET | Freq: Every day | ORAL | Status: DC

## 2012-08-20 NOTE — Progress Notes (Signed)
OFFICE NOTE  08/20/2012  CC:  Chief Complaint  Patient presents with  . Follow-up    3-mth.  Janet Hall; SOB]     HPI: Patient is a 27 y.o. Caucasian female who is here for 82mo f/u anxiety and asthma.   Still admits she is high strung/anxious most of the time but deals with it well.  Part of how she deals with it, though, is to breath faster and a bit heavier at times. This confuses things a bit with her b/c she sometimes interprets this as asthmatic sx's and uses her inhaler.  The albuterol then seems to make her all the more anxious.  On the other hand, I DO think there are times when her chest feels tight and she is feeling a bit sob and uses her albuterol appropriately.  No wheezing or cough, though.  Pertinent PMH:  Past Medical History  Diagnosis Date  . Migraine without aura     Onset in early teens.  Anywhere from 2 per week to 1 per month.    . Migraine headache without aura 11/02/2011  . GAD (generalized anxiety disorder) 11/21/2011  Asthma, intermittent.  (non wheezing-type).  MEDS:  Outpatient Prescriptions Prior to Visit  Medication Sig Dispense Refill  . albuterol (VENTOLIN HFA) 108 (90 BASE) MCG/ACT inhaler Inhale 2 puffs into the lungs every 4 (four) hours as needed for wheezing.  1 Inhaler  0  . citalopram (CELEXA) 20 MG tablet Take 1 tablet (20 mg total) by mouth daily.  30 tablet  6  . ferrous sulfate 325 (65 FE) MG EC tablet Take 325 mg by mouth 2 (two) times daily.      . clonazePAM (KLONOPIN) 1 MG tablet 1/2-1 tab po bid prn anxiety  60 tablet  1   No facility-administered medications prior to visit.    PE: Blood pressure 102/69, pulse 87, temperature 98.2 F (36.8 C), temperature source Oral, resp. rate 16, weight 119 lb 12 oz (54.318 kg), last menstrual period 07/26/2012, SpO2 98.00%. Gen: Alert, well appearing.  Patient is oriented to person, place, time, and situation. ENT: Ears: EACs clear, normal epithelium.  TMs with good light reflex and landmarks  bilaterally.  Eyes: no injection, icteris, swelling, or exudate.  EOMI, PERRLA. Nose: no drainage or turbinate edema/swelling.  No injection or focal lesion.  Mouth: lips without lesion/swelling.  Oral mucosa pink and moist.  Dentition intact and without obvious caries or gingival swelling.  Oropharynx without erythema, exudate, or swelling.  Neck - No masses or thyromegaly or limitation in range of motion CV: RRR, no m/r/g.   LUNGS: CTA bilat, nonlabored resps, good aeration in all lung fields. EXT: no clubbing, cyanosis, or edema.    IMPRESSION AND PLAN:  Generalized anxiety disorder>>mild intermittent asthma (non wheezing variant):  I encouraged her to use her albut only if she had chest tightness along with her feeling of SOB or "breathing funny". Hopefully, minimizing albut use will result in less anxiety and less tendency to feel like her breathing is fast/odd/troubled--she often doesn't feel this way until she begins to [facility] about it or DWELL on it. Additionally, we'll increase her citalopram to 30mg  qd x 7d, then increase to 40mg  qd dosing.  An After Visit Summary was printed and given to the patient.   FOLLOW UP: 3 mo

## 2012-08-20 NOTE — Patient Instructions (Signed)
Increase your citalopram to 30mg  (1 and 1/2 of the 20mg  tabs once daily) for 1 wk, then increase to 2 tabs once daily until the bottle is empty.   Then fill NEW rx for 40mg  citalopram tabs.

## 2012-08-26 ENCOUNTER — Telehealth: Payer: Self-pay | Admitting: Family Medicine

## 2012-08-26 NOTE — Telephone Encounter (Deleted)
PER NOTE in Comment Box of Visit Info by Lowella Bandy ATTAWAY: Patient wants to have a CT of her head because of on going headaches. Patient insurance runs out in 2 wks, wants it asap if approved.

## 2012-08-26 NOTE — Telephone Encounter (Signed)
I have seen her several times over the last year but I have not found any indication for a head CT scan. I know she has a history of migraine headaches, but this is not a reason to get a head CT scan. I will gladly refer her to a neurologist if she desires.  Let me know.  -thx

## 2012-08-26 NOTE — Telephone Encounter (Signed)
OK 

## 2012-08-26 NOTE — Telephone Encounter (Signed)
Patient called back since she was getting ready to leave for work. I read Dr. Samul Dada message to the patient. Patient is going to check with her neurologist to see what they recommend since her headaches are daily.

## 2012-08-29 NOTE — Telephone Encounter (Signed)
Patient wasn't able to get an appt with the neurologist in [facility] until the end of June. Patient is agreeable with going to a different neurologist if we can get her in before the 17th. Patient's mother is very concerned and wants to pursue the CT scan of the head to rule out a tumor. I will fax over the referral to Dr. Arbutus Leas at [facility] to see if she can see patient earlier. SW Dr. Don Perking office. She is out of the office this afternoon but will be back tomorrow.

## 2012-08-30 NOTE — Telephone Encounter (Signed)
Moved patient neurology appt to 09/02/12 10:30 with [facility]. Patient aware

## 2012-08-30 NOTE — Telephone Encounter (Signed)
Noted  

## 2012-09-12 ENCOUNTER — Telehealth: Payer: Self-pay | Admitting: Family Medicine

## 2012-09-12 MED ORDER — ALBUTEROL SULFATE HFA 108 (90 BASE) MCG/ACT IN AERS: 2.0000 | INHALATION_SPRAY | RESPIRATORY_TRACT | Status: DC | PRN

## 2012-09-12 NOTE — Telephone Encounter (Signed)
Rx request to pharmacy/[facility]  

## 2012-09-23 ENCOUNTER — Ambulatory Visit: Payer: 59 | Admitting: Family Medicine

## 2012-10-21 ENCOUNTER — Emergency Department (HOSPITAL_COMMUNITY)
Admission: EM | Admit: 2012-10-21 | Discharge: 2012-10-22 | Disposition: A | Discharge status: Home / Self Care | Payer: Self-pay | Attending: Emergency Medicine | Admitting: Emergency Medicine

## 2012-10-21 ENCOUNTER — Encounter (HOSPITAL_COMMUNITY): Payer: Self-pay | Admitting: Emergency Medicine

## 2012-10-21 ENCOUNTER — Emergency Department (HOSPITAL_COMMUNITY): Payer: Self-pay

## 2012-10-21 DIAGNOSIS — R110 Nausea: Secondary | ICD-10-CM | POA: Insufficient documentation

## 2012-10-21 DIAGNOSIS — Z8659 Personal history of other mental and behavioral disorders: Secondary | ICD-10-CM | POA: Insufficient documentation

## 2012-10-21 DIAGNOSIS — H53149 Visual discomfort, unspecified: Secondary | ICD-10-CM | POA: Insufficient documentation

## 2012-10-21 DIAGNOSIS — G43009 Migraine without aura, not intractable, without status migrainosus: Secondary | ICD-10-CM | POA: Insufficient documentation

## 2012-10-21 DIAGNOSIS — G43909 Migraine, unspecified, not intractable, without status migrainosus: Secondary | ICD-10-CM

## 2012-10-21 DIAGNOSIS — J329 Chronic sinusitis, unspecified: Secondary | ICD-10-CM | POA: Insufficient documentation

## 2012-10-21 DIAGNOSIS — Z79899 Other long term (current) drug therapy: Secondary | ICD-10-CM | POA: Insufficient documentation

## 2012-10-21 MED ORDER — DIPHENHYDRAMINE HCL 50 MG/ML IJ SOLN
25.0000 mg | Freq: Once | INTRAMUSCULAR | Status: DC
  Filled 2012-10-21: qty 1

## 2012-10-21 MED ORDER — KETOROLAC TROMETHAMINE 30 MG/ML IJ SOLN
30.0000 mg | Freq: Once | INTRAMUSCULAR | Status: DC
  Filled 2012-10-21: qty 1

## 2012-10-21 MED ORDER — DIPHENHYDRAMINE HCL 50 MG/ML IJ SOLN
25.0000 mg | Freq: Once | INTRAMUSCULAR | Status: AC
  Administered 2012-10-21: 25 mg via INTRAMUSCULAR
  Filled 2012-10-21: qty 1

## 2012-10-21 MED ORDER — KETOROLAC TROMETHAMINE 60 MG/2ML IM SOLN
60.0000 mg | Freq: Once | INTRAMUSCULAR | Status: AC
  Administered 2012-10-21: 60 mg via INTRAMUSCULAR
  Filled 2012-10-21: qty 2

## 2012-10-21 MED ORDER — SODIUM CHLORIDE 0.9 % IV BOLUS (SEPSIS)
1000.0000 mL | Freq: Once | INTRAVENOUS | Status: DC
Start: 2012-10-21 — End: 2012-10-21

## 2012-10-21 MED ORDER — METOCLOPRAMIDE HCL 5 MG/ML IJ SOLN
10.0000 mg | Freq: Once | INTRAMUSCULAR | Status: AC
  Administered 2012-10-21: 10 mg via INTRAMUSCULAR
  Filled 2012-10-21: qty 2

## 2012-10-21 MED ORDER — METOCLOPRAMIDE HCL 5 MG/ML IJ SOLN
10.0000 mg | Freq: Once | INTRAMUSCULAR | Status: DC
  Filled 2012-10-21: qty 2

## 2012-10-21 MED ORDER — DEXAMETHASONE SODIUM PHOSPHATE 10 MG/ML IJ SOLN
10.0000 mg | Freq: Once | INTRAMUSCULAR | Status: AC
  Administered 2012-10-21: 10 mg via INTRAMUSCULAR
  Filled 2012-10-21: qty 1

## 2012-10-21 NOTE — ED Provider Notes (Signed)
CSN: [id number]     Arrival date & time 10/21/12  2127 History     First MD Initiated Contact with Patient 10/21/12 2157     Chief Complaint  Patient presents with  . Migraine   (Consider location/radiation/quality/duration/timing/severity/associated sxs/prior Treatment) HPI Comments: Patient is a 27 year old female with a past medical history of chronic migraines who presents with a headache since this afternoon. Patient reports a gradual onset and progressive worsening of the headache. The pain is sharp, constant and is located in generalized head without radiation. Patient has tried Rizatriptan for symptoms without relief. No alleviating/aggravating factors. Patient reports associated nausea and photophobia. Patient denies fever, vomiting, diarrhea, numbness/tingling, weakness, visual changes, congestion, chest pain, SOB, abdominal pain.     Patient is a 27 y.o. female presenting with migraines.  Migraine Associated symptoms include headaches and nausea.    Past Medical History  Diagnosis Date  . Migraine without aura     Onset in early teens.  Anywhere from 2 per week to 1 per month.    . Migraine headache without aura 11/02/2011  . GAD (generalized anxiety disorder) 11/21/2011   History reviewed. No pertinent past surgical history. Family History  Problem Relation Age of Onset  . Arthritis Mother   . Cancer Mother 60    breast  . Hypertension Father   . Arthritis Maternal Grandmother     liver cancer  . Arthritis Maternal Grandfather     stomach cancer   History  Substance Use Topics  . Smoking status: Never Smoker   . Smokeless tobacco: Never Used  . Alcohol Use: No   OB History   Grav Para Term Preterm Abortions TAB SAB Ect Mult Living                 Review of Systems  Gastrointestinal: Positive for nausea.  Neurological: Positive for headaches.  All other systems reviewed and are negative.    Allergies  Review of patient's allergies indicates no known  allergies.  Home Medications   Current Outpatient Rx  Name  Route  Sig  Dispense  Refill  . albuterol (VENTOLIN HFA) 108 (90 BASE) MCG/ACT inhaler   Inhalation   Inhale 2 puffs into the lungs every 4 (four) hours as needed for wheezing.   1 Inhaler   2   . ferrous sulfate 325 (65 FE) MG EC tablet   Oral   Take 325 mg by mouth 2 (two) times daily.         . predniSONE (STERAPRED UNI-PAK) 10 MG tablet   Oral   Take 60 mg by mouth daily. 60 mg day one and reduce each day by 10mg  till done.         . rizatriptan (MAXALT) 10 MG tablet   Oral   Take 10 mg by mouth as needed for migraine. May repeat in 2 hours if needed          BP 116/78  Pulse 86  Temp(Src) 98.2 F (36.8 C) (Oral)  Resp 18  Ht 5\' 10"  (1.778 m)  Wt 121 lb (54.885 kg)  BMI 17.36 kg/m2  SpO2 99%  LMP 10/19/2012 Physical Exam  Nursing note and vitals reviewed. Constitutional: She is oriented to person, place, and time. She appears well-developed and well-nourished. No distress.  HENT:  Head: Normocephalic and atraumatic.  Eyes: Conjunctivae and EOM are normal. Pupils are equal, round, and reactive to light. No scleral icterus.  Neck: Normal range of motion.  No  meningeal signs.   Cardiovascular: Normal rate and regular rhythm.  Exam reveals no gallop and no friction rub.   No murmur heard. Pulmonary/Chest: Effort normal and breath sounds normal. She has no wheezes. She has no rales. She exhibits no tenderness.  Abdominal: Soft. She exhibits no distension. There is no tenderness. There is no rebound and no guarding.  Musculoskeletal: Normal range of motion.  Neurological: She is alert and oriented to person, place, and time. Coordination normal.  Speech is goal-oriented. Moves limbs without ataxia.   Skin: Skin is warm and dry.  Psychiatric: She has a normal mood and affect. Her behavior is normal.    ED Course   Procedures (including critical care time)  Labs Reviewed - No data to display Ct  Head Wo Contrast  10/22/2012   *RADIOLOGY REPORT*  Clinical Data: Headache.  No known injury.  CT HEAD WITHOUT CONTRAST  Technique:  Contiguous axial images were obtained from the base of the skull through the vertex without contrast.  Comparison: None.  Findings:  Calvarium:No acute osseous abnormality. No lytic or blastic lesion.  Orbits: No acute abnormality.  Sinuses:  There is frothy material within imaged left maxillary sinus, which may be atelectatic.  Patchy opacification of bilateral ethmoid air cells, more so in the left anterior cells. Mucosal thickening also present in the inferior left frontal sinus. There may be a fluid level in an anterior right ethmoid sinus.  There is leftward nasal septal deviation and spurring.  Right-sided concha bullosa.  No evidence of invasive sinus disease.  Brain: No evidence of acute abnormality, such as acute infarction, hemorrhage, hydrocephalus, or mass lesion/mass effect.  IMPRESSION:  1.  No acute intracranial findings. 2.  Inflammatory paranasal sinus disease with possible acute sinusitis in the ethmoids and left maxillary sinus.   Original Report Authenticated By: Tiburcio Pea   1. Migraine   2. Sinusitis     MDM  11:30 PM Patient will have IM toradol, reglan, and benadryl. Vitals stable and patient afebrile. Patient will have CT head.   12:54 AM Patient feels better after migraine cocktail. CT head shows sinusitis. I will treat patient for sinusitis and prescribed ativan to take PRN for anxiety which could be a possible cause of headaches. Patient further explains she notices a correlation with migraines and her anxiety. Vitals stable and patient afebrile.   Emilia Beck, PA-C 10/22/12 0106

## 2012-10-21 NOTE — ED Notes (Signed)
Pt states she is here for a migraine headache that started about 1530 this afternoon  Pt states she has taken medication without relief  Pt is c/o nausea without vomiting

## 2012-10-22 MED ORDER — LORAZEPAM 1 MG PO TABS: 1.0000 mg | ORAL_TABLET | Freq: Three times a day (TID) | ORAL | Status: DC | PRN

## 2012-10-22 MED ORDER — AMOXICILLIN-POT CLAVULANATE 875-125 MG PO TABS: 1.0000 | ORAL_TABLET | Freq: Two times a day (BID) | ORAL | Status: DC

## 2012-10-22 MED ORDER — AMOXICILLIN-POT CLAVULANATE 875-125 MG PO TABS
1.0000 | ORAL_TABLET | Freq: Once | ORAL | Status: AC
  Administered 2012-10-22: 1 via ORAL
  Filled 2012-10-22: qty 1

## 2012-10-23 NOTE — ED Provider Notes (Signed)
Medical screening examination/treatment/procedure(s) were performed by non-physician practitioner and as supervising physician I was immediately available for consultation/collaboration.   Laray Anger, DO 10/23/12 [phone]

## 2012-10-24 ENCOUNTER — Other Ambulatory Visit: Payer: Self-pay | Admitting: Family Medicine

## 2012-10-25 NOTE — Telephone Encounter (Signed)
Patient left a message stating she wanted to try a different migraine medication.  Patient never answered my call or returned my call to find out which medication she would like to try.

## 2012-10-28 ENCOUNTER — Telehealth: Payer: Self-pay | Admitting: Family Medicine

## 2012-10-28 NOTE — Telephone Encounter (Signed)
Patient lost insurance at the end of June and wanted to see if she could get prescribed fioricet because it is cheaper for her than maxalt.  Please advise

## 2012-10-29 MED ORDER — BUTALBITAL-APAP-CAFFEINE 50-325-40 MG PO CAPS: ORAL_CAPSULE | ORAL | Status: DC

## 2012-10-29 NOTE — Telephone Encounter (Signed)
OK.  Rx sent to [facility] pharmacy.

## 2012-10-29 NOTE — Telephone Encounter (Signed)
Resent fioricet to [facility] per patient request.

## 2012-12-17 ENCOUNTER — Encounter: Payer: Self-pay | Admitting: Family Medicine

## 2012-12-17 ENCOUNTER — Ambulatory Visit (INDEPENDENT_AMBULATORY_CARE_PROVIDER_SITE_OTHER): Payer: 59 | Admitting: Family Medicine

## 2012-12-17 VITALS — BP 122/73 | HR 86 | Temp 99.3°F | Resp 16 | Ht 69.0 in | Wt 122.0 lb

## 2012-12-17 DIAGNOSIS — J45901 Unspecified asthma with (acute) exacerbation: Secondary | ICD-10-CM

## 2012-12-17 DIAGNOSIS — J45909 Unspecified asthma, uncomplicated: Secondary | ICD-10-CM

## 2012-12-17 DIAGNOSIS — J4530 Mild persistent asthma, uncomplicated: Secondary | ICD-10-CM | POA: Insufficient documentation

## 2012-12-17 DIAGNOSIS — F411 Generalized anxiety disorder: Secondary | ICD-10-CM

## 2012-12-17 MED ORDER — PREDNISONE 20 MG PO TABS: ORAL_TABLET | ORAL | Status: DC

## 2012-12-17 MED ORDER — BUDESONIDE 180 MCG/ACT IN AEPB: 1.0000 | INHALATION_SPRAY | Freq: Two times a day (BID) | RESPIRATORY_TRACT | Status: DC

## 2012-12-17 MED ORDER — CLONAZEPAM 1 MG PO TABS: 1.0000 mg | ORAL_TABLET | Freq: Two times a day (BID) | ORAL | Status: DC | PRN

## 2012-12-17 MED ORDER — PAROXETINE HCL 20 MG PO TABS: ORAL_TABLET | ORAL | Status: DC

## 2012-12-17 NOTE — Progress Notes (Signed)
OFFICE NOTE  12/17/2012  CC:  Chief Complaint  Patient presents with  . Shortness of Breath    about 4 days  . Sore Throat  . Nasal Congestion     HPI: Patient is a 27 y.o. Caucasian female who is here for 4 days of ST, nasal congestion, feeling SOB.   Worse the last couple of days.  Chest feels tight and hard to get a deep breath.  Albuterol inhaler.   No fever.  ST gone.  Still with some sniffles, some cough productive of green tinged mucous. As per her usual, patient is struggling with lots of chronic anxiety, is hypochondriacal as per her usual. Recently quit job to start [employer] but has realized this is not for her so she is going to quit. She actually inquired today about whether or not she should apply for disability.  I told her NO I don't think she should apply for disability.   Pertinent PMH:  Past Medical History  Diagnosis Date  . Migraine without aura     Onset in early teens.  Anywhere from 2 per week to 1 per month.    . Migraine headache without aura 11/02/2011  . GAD (generalized anxiety disorder) 11/21/2011    MEDS:  Fioricet prn, Ventolin HFA 2p q4h prn, FeSo4 325mg  qd  PE: Blood pressure 122/73, pulse 86, temperature 99.3 F (37.4 C), temperature source Temporal, resp. rate 16, height 5\' 9"  (1.753 m), weight 122 lb (55.339 kg), last menstrual period 11/20/2012, SpO2 98.00%. VS: noted--normal. Gen: alert, NAD, WELL- APPEARING. HEENT: eyes without injection, drainage, or swelling.  Ears: EACs clear, TMs with normal light reflex and landmarks.  Nose: Clear rhinorrhea, with some dried, crusty exudate adherent to mildly injected mucosa.  No purulent d/c.  No paranasal sinus TTP.  No facial swelling.  Throat and mouth without focal lesion.  No pharyngial swelling, erythema, or exudate.   Neck: supple, no LAD.   LUNGS: CTA bilat, nonlabored resps.  Mildly diminished aeration on exhalation but no wheezing. CV: RRR, no m/r/g. EXT: no c/c/e SKIN:  no rash    IMPRESSION AND PLAN:  1) Acute asthma exacerbation, with poorly controlled asthma as well. Hard to tell how much her anxiety is playing a role in her day to day respiratory symptoms---she tends to hyperventilate a lot. Prednisone 40mg  qd x 5d.  Ventolin 2 p q4h prn.  2) GAD, severe.  Hypochondriasis to some extent.  Lots of tension/migrain HA's. Citalopram 20mg  had become ineffective and our trial of higher dose was not tolerated.  She has been off med now for months. She needs to try another SSRI: started paroxetine today, 10mg  qd x 2 wks and then increase to 20mg  qd. She says she has some clonazepam at home for prn use, which tells me she likely is not using this med very much b/c she is afraid of it.  3) Mild persistent asthma (with superimposed chronic anxiety-related breathing issues): told pt I'd be glad to refer her to asthma/allergist at any time in the future but she declined today. Financial issues/lack of insurance play a large role in her medical issues these days. Samples (6 inhalers) of pulmicort flexhaler, 2 puffs qd---demonstrated inhaler use with pt and she took 2 puffs in office today. Continue prn ventolin for rescue.   FOLLOW UP: 11mo, earlier if needed.

## 2012-12-17 NOTE — Patient Instructions (Signed)
Take 1/2 of the 20mg  paroxetine (paxil) tab once nightly for 2 wks, then increase to whole tab once nightly.  Start pulmicort flexhaler 2 puff once daily.  Rinse mouth with water after using this inhaler.

## 2013-01-10 ENCOUNTER — Ambulatory Visit (INDEPENDENT_AMBULATORY_CARE_PROVIDER_SITE_OTHER): Payer: Self-pay | Admitting: Family Medicine

## 2013-01-10 ENCOUNTER — Encounter: Payer: Self-pay | Admitting: Family Medicine

## 2013-01-10 VITALS — BP 129/71 | HR 72 | Temp 99.0°F | Resp 18 | Ht 69.0 in | Wt 126.0 lb

## 2013-01-10 DIAGNOSIS — L259 Unspecified contact dermatitis, unspecified cause: Secondary | ICD-10-CM

## 2013-01-10 DIAGNOSIS — L249 Irritant contact dermatitis, unspecified cause: Secondary | ICD-10-CM | POA: Insufficient documentation

## 2013-01-10 NOTE — Progress Notes (Signed)
OFFICE NOTE  01/10/2013  CC:  Chief Complaint  Patient presents with  . Rash    a couple days     HPI: Patient is a 27 y.o. Caucasian female who is here for rash.   Couple days of whelp-like rash on abdomen, coming and going, taking benadryl for it, itches some. Tried a new lotion recently--it contained benzoic acid which was not in her prior lotion.  Also noted some on chest, back, and abdomen. No tongue or lips swelling, no eye swelling, no wheezing or SOB. (She put the new lotion on legs and stomach--no rash on legs though). No new foods, no new contact irritants or allergens except the new lotion.  No new meds except pulmicort but this was started about 2 wks ago. No recent URI/GE or other infection.  Pertinent PMH:  Past Medical History  Diagnosis Date  . Migraine without aura     Onset in early teens.  Anywhere from 2 per week to 1 per month.    Marland Kitchen GAD (generalized anxiety disorder) 11/21/2011  . Asthma, mild persistent     MEDS:  Outpatient Prescriptions Prior to Visit  Medication Sig Dispense Refill  . albuterol (VENTOLIN HFA) 108 (90 BASE) MCG/ACT inhaler Inhale 2 puffs into the lungs every 4 (four) hours as needed for wheezing.  1 Inhaler  2  . budesonide (PULMICORT FLEXHALER) 180 MCG/ACT inhaler Inhale 1 puff into the lungs 2 (two) times daily.  1 Inhaler  6  . Butalbital-APAP-Caffeine 50-325-40 MG per capsule 1-2 tabs po tid prn headache  30 capsule  1  . clonazePAM (KLONOPIN) 1 MG tablet Take 1 tablet (1 mg total) by mouth 2 (two) times daily as needed for anxiety.  20 tablet  1  . ferrous sulfate 325 (65 FE) MG EC tablet Take 325 mg by mouth 2 (two) times daily.      Marland Kitchen PARoxetine (PAXIL) 20 MG tablet 1 tab po qhs  30 tablet  6  . predniSONE (DELTASONE) 20 MG tablet 2 tabs po qd x 5d  10 tablet  0  . rizatriptan (MAXALT) 10 MG tablet Take 10 mg by mouth as needed for migraine. May repeat in 2 hours if needed       No facility-administered medications prior to  visit.    PE: Blood pressure 129/71, pulse 72, temperature 99 F (37.2 C), temperature source Temporal, resp. rate 18, height 5\' 9"  (1.753 m), weight 126 lb (57.153 kg), last menstrual period 12/18/2012, SpO2 97.00%. Gen: Alert, well appearing.  Patient is oriented to person, place, time, and situation. ENT: Eyes: no injection, icteris, swelling, or exudate.  EOMI, PERRLA.  Mouth: lips without lesion/swelling.  Oral mucosa pink and moist.   SKIN: Trunk with very fine scattered maculopapular rash--very faint and difficult to discern. No vesicles, no hives, no pustules.  Extremities and face are without rash.  IMPRESSION AND PLAN:  Rash, likely mild contact irritant dermatitis. Reassured pt, advised her to avoid use of the new lotion (or any lotion until this clears up). May continue benadryl prn. Signs/symptoms to call or return for were reviewed and pt expressed understanding.  An After Visit Summary was printed and given to the patient.  FOLLOW UP:  prn

## 2013-02-08 ENCOUNTER — Emergency Department (INDEPENDENT_AMBULATORY_CARE_PROVIDER_SITE_OTHER)
Admission: EM | Admit: 2013-02-08 | Discharge: 2013-02-08 | Disposition: A | Discharge status: Home / Self Care | Payer: Self-pay | Source: Home / Self Care | Attending: Emergency Medicine | Admitting: Emergency Medicine

## 2013-02-08 ENCOUNTER — Encounter (HOSPITAL_COMMUNITY): Payer: Self-pay | Admitting: Emergency Medicine

## 2013-02-08 DIAGNOSIS — H68012 Acute Eustachian salpingitis, left ear: Secondary | ICD-10-CM

## 2013-02-08 DIAGNOSIS — H68019 Acute Eustachian salpingitis, unspecified ear: Secondary | ICD-10-CM

## 2013-02-08 MED ORDER — FLUTICASONE PROPIONATE 50 MCG/ACT NA SUSP: 2.0000 | Freq: Every day | NASAL | Status: DC

## 2013-02-08 MED ORDER — AMOXICILLIN-POT CLAVULANATE 875-125 MG PO TABS: 1.0000 | ORAL_TABLET | Freq: Two times a day (BID) | ORAL | Status: DC

## 2013-02-08 NOTE — ED Notes (Signed)
Left ear pain, no other complaint, no injury

## 2013-02-08 NOTE — ED Provider Notes (Signed)
Chief Complaint:   Chief Complaint  Patient presents with  . Otalgia    History of Present Illness:   Janet Hall is a 27 year old female who is had a two-day history of discomfort and a fluttering sensation in her left ear. This gives her a headache. She denies any difficulty hearing, ringing in the ear, or drainage from the ear. She's had no fever, chills, sore throat, or cough. She does have slight nasal congestion. No history of allergies, but she does have a history of asthma.  Review of Systems:  Other than noted above, the patient denies any of the following symptoms: Systemic:  No fevers, chills, sweats, weight loss or gain, fatigue, or tiredness. Eye:  No redness, pain, discharge, itching, blurred vision, or diplopia. ENT:  No headache, nasal congestion, sneezing, itching, epistaxis, ear pain, congestion, decreased hearing, ringing in ears, vertigo, or tinnitus.  No oral lesions, sore throat, pain on swallowing, or hoarseness. Neck:  No mass, tenderness or adenopathy. Lungs:  No coughing, wheezing, or shortness of breath. Skin:  No rash or itching.  PMFSH:  Past medical history, family history, social history, meds, and allergies were reviewed.  Physical Exam:   Vital signs:  BP 125/83  Pulse 70  Temp(Src) 98.3 F (36.8 C) (Oral)  Resp 18  SpO2 100%  LMP 01/14/2013 General:  Alert and oriented.  In no distress.  Skin warm and dry. Eye:  PERRL, full EOMs, lids and conjunctiva normal.   ENT:  TMs and canals clear.  Nasal mucosa not congested and without drainage.  Mucous membranes moist, no oral lesions, normal dentition, pharynx clear.  No cranial or facial pain to palplation. The right ear canal was completely clear. There were a few small flakes of wax, but the canal and TM are normal. There was no foreign body. No fluid or pus-the TM, no inflammation or redness of the TM or the ear canal. There is no pain to palpation over the TMJs. Neck:  Supple, full ROM.  No adenopathy,  tenderness or mass.  Thyroid normal. Lungs:  Breath sounds clear and equal bilaterally.  No wheezes, rales or rhonchi. Heart:  Rhythm regular, without extrasystoles.  No gallops or murmers. Skin:  Clear, warm and dry.  Assessment:  The encounter diagnosis was Eustachian salpingitis, acute, left.  She has ear discomfort without any obvious finding on examination suggesting eustachian salpingitis.  Plan:   1.  Meds:  The following meds were prescribed:   Discharge Medication List as of 02/08/2013  9:49 AM    START taking these medications   Details  amoxicillin-clavulanate (AUGMENTIN) 875-125 MG per tablet Take 1 tablet by mouth 2 (two) times daily., Starting 02/08/2013, Until Discontinued, Print    fluticasone (FLONASE) 50 MCG/ACT nasal spray Place 2 sprays into both nostrils daily., Starting 02/08/2013, Until Discontinued, Normal        2.  Patient Education/Counseling:  The patient was given appropriate handouts, self care instructions, and instructed in symptomatic relief.  Suggested trying over-the-counter antihistamine/decongestant combinations along with Flonase nasal spray and if no better in 3 or 4 days may start the Augmentin. If these things help, suggested followup with her primary care doctor for referral to an ear nose and throat specialist.  3.  Follow up:  The patient was told to follow up if no better in 3 to 4 days, if becoming worse in any way, and given some red flag symptoms such as worsening pain which would prompt immediate return.  Follow up  with her primary care Dr. if needed.   Reuben Likes, MD 02/08/13 [phone]

## 2013-02-11 ENCOUNTER — Encounter: Payer: Self-pay | Admitting: Family Medicine

## 2013-03-18 ENCOUNTER — Ambulatory Visit: Payer: Self-pay | Admitting: Family Medicine

## 2013-04-26 ENCOUNTER — Encounter (HOSPITAL_COMMUNITY): Payer: Self-pay | Admitting: Emergency Medicine

## 2013-04-26 ENCOUNTER — Emergency Department (INDEPENDENT_AMBULATORY_CARE_PROVIDER_SITE_OTHER)
Admission: EM | Admit: 2013-04-26 | Discharge: 2013-04-26 | Disposition: A | Discharge status: Home / Self Care | Payer: Self-pay | Source: Home / Self Care | Attending: Emergency Medicine | Admitting: Emergency Medicine

## 2013-04-26 DIAGNOSIS — K2990 Gastroduodenitis, unspecified, without bleeding: Secondary | ICD-10-CM

## 2013-04-26 DIAGNOSIS — K2970 Gastritis, unspecified, without bleeding: Secondary | ICD-10-CM

## 2013-04-26 LAB — POCT H PYLORI SCREEN: H. PYLORI SCREEN, POC: NEGATIVE

## 2013-04-26 LAB — POCT I-STAT, CHEM 8
BUN: 8 mg/dL (ref 6–23)
CALCIUM ION: 1.18 mmol/L (ref 1.12–1.23)
CREATININE: 1 mg/dL (ref 0.50–1.10)
Chloride: 103 mEq/L (ref 96–112)
GLUCOSE: 100 mg/dL — AB (ref 70–99)
HCT: 45 % (ref 36.0–46.0)
Hemoglobin: 15.3 g/dL — ABNORMAL HIGH (ref 12.0–15.0)
Potassium: 4.1 mEq/L (ref 3.7–5.3)
Sodium: 140 mEq/L (ref 137–147)
TCO2: 25 mmol/L (ref 0–100)

## 2013-04-26 LAB — POCT URINALYSIS DIP (DEVICE)
Bilirubin Urine: NEGATIVE
Glucose, UA: NEGATIVE mg/dL
HGB URINE DIPSTICK: NEGATIVE
Ketones, ur: NEGATIVE mg/dL
Leukocytes, UA: NEGATIVE
Nitrite: NEGATIVE
PH: 6 (ref 5.0–8.0)
PROTEIN: NEGATIVE mg/dL
SPECIFIC GRAVITY, URINE: 1.025 (ref 1.005–1.030)
UROBILINOGEN UA: 0.2 mg/dL (ref 0.0–1.0)

## 2013-04-26 LAB — POCT PREGNANCY, URINE: Preg Test, Ur: NEGATIVE

## 2013-04-26 MED ORDER — GI COCKTAIL ~~LOC~~
30.0000 mL | Freq: Once | ORAL | Status: AC
  Administered 2013-04-26: 30 mL via ORAL

## 2013-04-26 MED ORDER — OMEPRAZOLE 20 MG PO CPDR: 20.0000 mg | DELAYED_RELEASE_CAPSULE | Freq: Two times a day (BID) | ORAL | Status: DC

## 2013-04-26 MED ORDER — GI COCKTAIL ~~LOC~~
ORAL | Status: AC
  Filled 2013-04-26: qty 30

## 2013-04-26 NOTE — ED Provider Notes (Signed)
Chief Complaint   Chief Complaint  Patient presents with  . Abdominal Pain    History of Present Illness   Janet Hall is a 28 year old female who has had a 2 to three-day history of epigastric pain without radiation. This is described as a burning pain and rated as 7/10 in intensity. The pain comes and goes, but seems unrelated to meals. She has not tried any antacids. She denies any fever, chills, nausea, or vomiting. Her appetite has been good and. Her bowel movements have been regular. Sore stools have been loose but there's been no blood in her stool or melena. She denies any urinary symptoms. She has had a slight vaginal discharge but no itching or pelvic pain. She has no history of ulcers or gastritis. She is under considerable stress, drinks a large amount of caffeine-containing sodas per day, and has a history of migraine headaches for which he takes many Goody powders. It hurts to touch or to mash on the area.  Review of Systems   Other than as noted above, the patient denies any of the following symptoms: Constitutional:  No fever, chills, fatigue, weight loss or anorexia. Lungs:  No cough or shortness of breath. Heart:  No chest pain, palpitations, syncope or edema.  No cardiac history. Abdomen:  No nausea, vomiting, hematememesis, melena, diarrhea, or hematochezia. GU:  No dysuria, frequency, urgency, or hematuria. Gyn:  No vaginal discharge, itching, abnormal bleeding, dyspareunia, or pelvic pain.  PMFSH   Past medical history, family history, social history, meds, and allergies were reviewed along with nurse's notes.  No prior abdominal surgeries, past history of GI problems, STDs or GYN problems.  No excessive alcohol intake.   Physical Exam     Vital signs:  BP 109/67  Pulse 72  Temp(Src) 98.2 F (36.8 C) (Oral)  Resp 18  SpO2 100%  LMP 04/11/2013 Gen:  Alert, oriented, in no distress. Lungs:  Breath sounds clear and equal bilaterally.  No wheezes, rales or  rhonchi. Heart:  Regular rhythm.  No gallops or murmers.   Abdomen:  There is mild epigastric tenderness to palpation without guarding or rebound. No organomegaly or mass. Bowel sounds are normally active. Murphy sign and Murphy's punch were negative. She has no hernia or mass. Skin:  Clear, warm and dry.  No rash.  Labs   Results for orders placed during the hospital encounter of 04/26/13  POCT URINALYSIS DIP (DEVICE)      Result Value Range   Glucose, UA NEGATIVE  NEGATIVE mg/dL   Bilirubin Urine NEGATIVE  NEGATIVE   Ketones, ur NEGATIVE  NEGATIVE mg/dL   Specific Gravity, Urine 1.025  1.005 - 1.030   Hgb urine dipstick NEGATIVE  NEGATIVE   pH 6.0  5.0 - 8.0   Protein, ur NEGATIVE  NEGATIVE mg/dL   Urobilinogen, UA 0.2  0.0 - 1.0 mg/dL   Nitrite NEGATIVE  NEGATIVE   Leukocytes, UA NEGATIVE  NEGATIVE  POCT PREGNANCY, URINE      Result Value Range   Preg Test, Ur NEGATIVE  NEGATIVE  POCT I-STAT, CHEM 8      Result Value Range   Sodium 140  137 - 147 mEq/L   Potassium 4.1  3.7 - 5.3 mEq/L   Chloride 103  96 - 112 mEq/L   BUN 8  6 - 23 mg/dL   Creatinine, Ser 9.521.00  0.50 - 1.10 mg/dL   Glucose, Bld 841100 (*) 70 - 99 mg/dL   Calcium, Ion 3.241.18  1.12 - 1.23 mmol/L   TCO2 25  0 - 100 mmol/L   Hemoglobin 15.3 (*) 12.0 - 15.0 g/dL   HCT 16.1  09.6 - 04.5 %  POCT H PYLORI SCREEN      Result Value Range   H. PYLORI SCREEN, POC NEGATIVE  NEGATIVE    Course in Urgent Care Center   She was given a dose of GI cocktail and experience some relief.  Assessment   The encounter diagnosis was Gastritis.  Possibly due to excessive use of caffeine or Goody powders.  Plan     1.  Meds:  The following meds were prescribed:   Discharge Medication List as of 04/26/2013  2:15 PM    START taking these medications   Details  omeprazole (PRILOSEC) 20 MG capsule Take 1 capsule (20 mg total) by mouth 2 (two) times daily before a meal., Starting 04/26/2013, Until Discontinued, Normal        2.   Patient Education/Counseling:  The patient was given appropriate handouts, self care instructions, and instructed in symptomatic relief.  Encouraged to avoid caffeine-containing drinks and Goody powders.  3.  Follow up:  The patient was told to follow up here if no better in 3 to 4 days, or sooner if becoming worse in any way, and given some red flag symptoms such as worsening pain, fever, vomiting, or evidence of GI bleeding which would prompt immediate return.  Follow up here as necessary, and with Dr. Jeani Hawking within the next couple of weeks.Reuben Likes, MD 04/26/13 2111

## 2013-04-26 NOTE — Discharge Instructions (Signed)
Diet for Peptic Ulcer Disease Peptic ulcer disease is a term used to describe open sores in the stomach and digestive tract. These sores can be painful, and the pain can be worse when the stomach is empty. These ulcers can lead to bleeding in the esophagus, stomach, and intestines (gastrointestinaltract). Nutrition therapy can help ease the discomfort of peptic ulcer disease.   GENERAL DIET GUIDELINES FOR PEPTIC ULCER DISEASE  Your diet should be rich in fruits, vegetables, and whole grains. It should also be low in fat.   Avoid foods that can irritate your sores.   Avoid foods that are not tolerated or foods that cause pain. This may be different for different people. FOODS AND DRINKS TO AVOID  High-fat dairy and milk products including whole milk, cream, chocolate milk, butter, sour cream, or cream cheese.   High-fat meats and poultry including hot dogs, fried meats or poultry, meats with skin, sausage, or bacon.   Pepper.  Alcohol.  Chocolate.  Tea, coffee, and cola (regular and caffeine).  Lard or hydrogenated oils such as stick margarine.  SAMPLE MEAL PLAN This meal plan is approximately 2000 calories based on ChooseMy[employer].gov meal planning guidelines.  Breakfast   cup cooked oatmeal.  1 cup strawberries.  1 cup low-fat milk.  1 oz almonds. Snack  1 cup cucumber slices.  6 oz yogurt (made from low-fat or fat-free milk). Lunch  2 slices whole-wheat bread.  2 oz sliced turkey.  2 tsp mayonnaise.  1 cup blueberries.  1 cup snap peas. Snack  6 whole-wheat crackers.  1 oz string cheese. Dinner   cup brown rice.  1 cup mixed veggies.  1 tsp olive oil.  3 oz grilled fish. Document Released: 06/05/2011 Document Reviewed: 06/05/2011 [employer] Patient Information 2014 [employer].  

## 2013-04-26 NOTE — ED Notes (Signed)
C/o upper abd pain for three days  States belly is tender to touch States when hot water touches area the area burns States heating pad does help Denies any diarrhea, nausea, and vomiting

## 2013-05-15 ENCOUNTER — Encounter: Payer: Self-pay | Admitting: Family Medicine

## 2013-05-15 ENCOUNTER — Ambulatory Visit: Payer: Self-pay | Admitting: Family Medicine

## 2013-05-15 ENCOUNTER — Ambulatory Visit (INDEPENDENT_AMBULATORY_CARE_PROVIDER_SITE_OTHER): Payer: Self-pay | Admitting: Family Medicine

## 2013-05-15 VITALS — BP 108/78 | HR 61 | Temp 98.1°F | Resp 16 | Ht 69.0 in | Wt 126.0 lb

## 2013-05-15 DIAGNOSIS — S63509A Unspecified sprain of unspecified wrist, initial encounter: Secondary | ICD-10-CM

## 2013-05-15 DIAGNOSIS — S63502A Unspecified sprain of left wrist, initial encounter: Secondary | ICD-10-CM

## 2013-05-15 NOTE — Progress Notes (Signed)
OFFICE NOTE  05/15/2013  CC:  Chief Complaint  Patient presents with  . Wrist Pain    left wrist X 1 week     HPI: Patient is a 28 y.o. white female who is here for 1 week history of aching in left wrist.  No injury or overuse recalled.  No swelling or redness or other color change to the wrist or hand.  No paresthesias or wrist/hand weakness.  Flexion/extension make pain worse.  Pain interfering with sleep lately.  Also some intermittent pain in left deltoid area, with some vague tingling between left wrist and deltoid intermittently, brief.  Neck w/out pain.  Pertinent PMH:  Past medical, surgical, social, and family history reviewed and no changes are noted since last office visit.  MEDS:  Outpatient Prescriptions Prior to Visit  Medication Sig Dispense Refill  . amoxicillin-clavulanate (AUGMENTIN) 875-125 MG per tablet Take 1 tablet by mouth 2 (two) times daily.  20 tablet  0  . albuterol (VENTOLIN HFA) 108 (90 BASE) MCG/ACT inhaler Inhale 2 puffs into the lungs every 4 (four) hours as needed for wheezing.  1 Inhaler  2  . budesonide (PULMICORT FLEXHALER) 180 MCG/ACT inhaler Inhale 1 puff into the lungs 2 (two) times daily.  1 Inhaler  6  . Butalbital-APAP-Caffeine 50-325-40 MG per capsule 1-2 tabs po tid prn headache  30 capsule  1  . clonazePAM (KLONOPIN) 1 MG tablet Take 1 tablet (1 mg total) by mouth 2 (two) times daily as needed for anxiety.  20 tablet  1  . ferrous sulfate 325 (65 FE) MG EC tablet Take 325 mg by mouth 2 (two) times daily.      . fluticasone (FLONASE) 50 MCG/ACT nasal spray Place 2 sprays into both nostrils daily.  16 g  0  . omeprazole (PRILOSEC) 20 MG capsule Take 1 capsule (20 mg total) by mouth 2 (two) times daily before a meal.  60 capsule  2  . PARoxetine (PAXIL) 20 MG tablet 1 tab po qhs  30 tablet  6  . predniSONE (DELTASONE) 20 MG tablet 2 tabs po qd x 5d  10 tablet  0  . rizatriptan (MAXALT) 10 MG tablet Take 10 mg by mouth as needed for migraine. May  repeat in 2 hours if needed       No facility-administered medications prior to visit.    PE: Blood pressure 108/78, pulse 61, temperature 98.1 F (36.7 C), temperature source Temporal, resp. rate 16, height 5\' 9"  (1.753 m), weight 126 lb (57.153 kg), last menstrual period 05/09/2013, SpO2 100.00%. Gen: Alert, well appearing.  Patient is oriented to person, place, time, and situation. Left wrist w/out any deformity, swelling, erythema, or warmth. Radial pulse 2+ bilat.  No pallor or cyanosis.  Wrist ROM brings more pain.  Finkelstein's testing elicited pain in dorsal, radial portion of wrist.  She has mild tenderness in this area when palpated.  Mild TTP in deltoid muscle and mild pain with shoulder abduction/ER/IR.  Spurling's neg.  Neck ROM normal.  Arm/hand/finger strength 5/5.   IMPRESSION AND PLAN:  Left wrist sprain. Mild left deltoid tenderness. Reassured patient that conservative course is best option at this time: no x-rays or labs needed at this time. I recommended 10d of wearing thumb spica splint--do some gentle ROM exercises intermittently, and take 600 mg ibuprofen with food tid x 10d.  An After Visit Summary was printed and given to the patient.  FOLLOW UP: prn

## 2013-05-15 NOTE — Progress Notes (Signed)
Pre visit review using our clinic review tool, if applicable. No additional management support is needed unless otherwise documented below in the visit note. 

## 2013-05-21 ENCOUNTER — Ambulatory Visit (INDEPENDENT_AMBULATORY_CARE_PROVIDER_SITE_OTHER): Payer: Self-pay | Admitting: Family Medicine

## 2013-05-21 ENCOUNTER — Encounter: Payer: Self-pay | Admitting: Family Medicine

## 2013-05-21 VITALS — BP 131/88 | HR 87 | Temp 99.4°F | Resp 18 | Ht 69.0 in | Wt 126.0 lb

## 2013-05-21 DIAGNOSIS — K2970 Gastritis, unspecified, without bleeding: Secondary | ICD-10-CM

## 2013-05-21 DIAGNOSIS — K2990 Gastroduodenitis, unspecified, without bleeding: Secondary | ICD-10-CM

## 2013-05-21 DIAGNOSIS — F411 Generalized anxiety disorder: Secondary | ICD-10-CM

## 2013-05-21 DIAGNOSIS — R071 Chest pain on breathing: Secondary | ICD-10-CM

## 2013-05-21 DIAGNOSIS — R0789 Other chest pain: Secondary | ICD-10-CM

## 2013-05-21 MED ORDER — OMEPRAZOLE 20 MG PO CPDR: 20.0000 mg | DELAYED_RELEASE_CAPSULE | Freq: Every day | ORAL | Status: DC

## 2013-05-21 NOTE — Progress Notes (Signed)
OFFICE NOTE  05/21/2013  CC:  Chief Complaint  Patient presents with  . Chest Pain    above left breast  . Abdominal Pain    upper abd pain     HPI: Patient is a 28 y.o. Caucasian female who is here for pain in area under left clavicle described as a mild, vague pressure, constant, nothing makes it worse, better if she is distracted, better with heating pad applied.  It is not made worse by activity or by lying in any certain position.  No fevers. No strain or trauma.  Denies palpitations. Has been taking excedrin migraine lately, 2 per day.  Has seen neurologist for HA's and no treatments have helped.   Excedrin does help the HA. Taking omeprazole 20mg  bid and avoiding NSAIDs (except what is in excedrin) since dx of gastritis about 6 wks ago.  Still with some mild discomfort in this area.   She was eating a GERD/gastritis diet for a while but admits to reverting back to relatively poor diet lately. Has not had to use her albuterol with any frequency in last 2 mo.  Takes pulmicort daily.  Not on paxil or clonazepam.  With further questioning about her paxil/anxiety, etc, it seems she has not taken her SSRI on a daily basis as advised.  She also says the clonazepam made her feel "weird" so she doesn't take it.  Pertinent PMH:  Past medical, surgical, social, and family history reviewed and no changes are noted since last office visit.  MEDS:  Pt not on prednisone or augmentin as listed below.  She is not sure if she is taking her paxil or not. Outpatient Prescriptions Prior to Visit  Medication Sig Dispense Refill  . albuterol (VENTOLIN HFA) 108 (90 BASE) MCG/ACT inhaler Inhale 2 puffs into the lungs every 4 (four) hours as needed for wheezing.  1 Inhaler  2  . budesonide (PULMICORT FLEXHALER) 180 MCG/ACT inhaler Inhale 1 puff into the lungs 2 (two) times daily.  1 Inhaler  6  . omeprazole (PRILOSEC) 20 MG capsule Take 1 capsule (20 mg total) by mouth 2 (two) times daily before a meal.   60 capsule  2  . Butalbital-APAP-Caffeine 50-325-40 MG per capsule 1-2 tabs po tid prn headache  30 capsule  1  . clonazePAM (KLONOPIN) 1 MG tablet Take 1 tablet (1 mg total) by mouth 2 (two) times daily as needed for anxiety.  20 tablet  1  . ferrous sulfate 325 (65 FE) MG EC tablet Take 325 mg by mouth 2 (two) times daily.      . fluticasone (FLONASE) 50 MCG/ACT nasal spray Place 2 sprays into both nostrils daily.  16 g  0  . PARoxetine (PAXIL) 20 MG tablet 1 tab po qhs  30 tablet  6  . rizatriptan (MAXALT) 10 MG tablet Take 10 mg by mouth as needed for migraine. May repeat in 2 hours if needed      . amoxicillin-clavulanate (AUGMENTIN) 875-125 MG per tablet Take 1 tablet by mouth 2 (two) times daily.  20 tablet  0  . predniSONE (DELTASONE) 20 MG tablet 2 tabs po qd x 5d  10 tablet  0   No facility-administered medications prior to visit.    PE: Blood pressure 131/88, pulse 87, temperature 99.4 F (37.4 C), temperature source Temporal, resp. rate 18, height 5\' 9"  (1.753 m), weight 126 lb (57.153 kg), last menstrual period 05/09/2013, SpO2 98.00%. Exam chaperoned by CMA Faythe Ghee. Gen: Alert, well  appearing.  Patient is oriented to person, place, time, and situation. MWN:UUVOENT:Eyes: no injection, icteris, swelling, or exudate.  EOMI, PERRLA. Mouth: lips without lesion/swelling.  Oral mucosa pink and moist. Oropharynx without erythema, exudate, or swelling.  Neck - No masses or thyromegaly or limitation in range of motion CV: RRR, no m/r/g.  Chest wall palpation revealed a vague discomfort to palpation (reproduction of her pain complaint) of upper left chest wall--about a fist-sized area inferior to the left clavicle. No clavicular tenderness, no supraclavicular mass or tenderness. LUNGS: CTA bilat, nonlabored resps, good aeration in all lung fields. ABD: soft, nontender except for a small area of discomfort to palpation in sub-xyphoid region, ND, BS normal.  No hepatospenomegaly or mass.  No  bruits. EXT: no clubbing, cyanosis, or edema.    IMPRESSION AND PLAN:  1) Chest wall pain: reassured pt.   No red flags for other potential dx such as PE, pneumothorax, pericarditis, cardiac ischemia, or pleurisy.  2) GAD: discussed meds with pt, encouraged her to make sure she is still taking her paxil.  If not still taking paxil, I recommended she restart this and take it daily.  3) Gastritis: improving.  Continue omeprazole but decrease dosing to one tab qAM.  FOLLOW UP: 6 wks

## 2013-05-21 NOTE — Progress Notes (Signed)
Pre visit review using our clinic review tool, if applicable. No additional management support is needed unless otherwise documented below in the visit note. 

## 2013-06-17 ENCOUNTER — Ambulatory Visit: Payer: Self-pay | Admitting: Family Medicine

## 2013-06-17 ENCOUNTER — Ambulatory Visit: Payer: Self-pay | Admitting: Nurse Practitioner

## 2013-06-19 ENCOUNTER — Ambulatory Visit (INDEPENDENT_AMBULATORY_CARE_PROVIDER_SITE_OTHER): Payer: Self-pay | Admitting: Family Medicine

## 2013-06-19 ENCOUNTER — Encounter: Payer: Self-pay | Admitting: Family Medicine

## 2013-06-19 VITALS — BP 124/79 | HR 76 | Temp 99.2°F | Resp 16 | Ht 69.0 in | Wt 125.0 lb

## 2013-06-19 DIAGNOSIS — H538 Other visual disturbances: Secondary | ICD-10-CM

## 2013-06-19 DIAGNOSIS — R5383 Other fatigue: Secondary | ICD-10-CM

## 2013-06-19 DIAGNOSIS — R42 Dizziness and giddiness: Secondary | ICD-10-CM

## 2013-06-19 DIAGNOSIS — R51 Headache: Secondary | ICD-10-CM

## 2013-06-19 DIAGNOSIS — R110 Nausea: Secondary | ICD-10-CM

## 2013-06-19 DIAGNOSIS — R5381 Other malaise: Secondary | ICD-10-CM

## 2013-06-19 LAB — COMPREHENSIVE METABOLIC PANEL
ALT: 14 U/L (ref 0–35)
AST: 18 U/L (ref 0–37)
Albumin: 4.2 g/dL (ref 3.5–5.2)
Alkaline Phosphatase: 39 U/L (ref 39–117)
BILIRUBIN TOTAL: 1.1 mg/dL (ref 0.3–1.2)
BUN: 9 mg/dL (ref 6–23)
CO2: 28 meq/L (ref 19–32)
CREATININE: 0.8 mg/dL (ref 0.4–1.2)
Calcium: 8.9 mg/dL (ref 8.4–10.5)
Chloride: 104 mEq/L (ref 96–112)
GFR: 87.8 mL/min (ref >60.00)
Glucose, Bld: 74 mg/dL (ref 70–99)
Potassium: 3.7 mEq/L (ref 3.5–5.1)
Sodium: 137 mEq/L (ref 135–145)
Total Protein: 6.3 g/dL (ref 6.0–8.3)

## 2013-06-19 LAB — TSH: TSH: 1.31 u[IU]/mL (ref 0.35–5.50)

## 2013-06-19 LAB — CBC WITH DIFFERENTIAL/PLATELET
BASOS ABS: 0 10*3/uL (ref 0.0–0.1)
Basophils Relative: 0.4 % (ref 0.0–3.0)
Eosinophils Absolute: 0.2 10*3/uL (ref 0.0–0.7)
Eosinophils Relative: 2.1 % (ref 0.0–5.0)
HEMATOCRIT: 39.8 % (ref 36.0–46.0)
Hemoglobin: 13.2 g/dL (ref 12.0–15.0)
LYMPHS ABS: 1.2 10*3/uL (ref 0.7–4.0)
Lymphocytes Relative: 14.1 % (ref 12.0–46.0)
MCHC: 33.2 g/dL (ref 30.0–36.0)
MCV: 91.8 fl (ref 78.0–100.0)
MONO ABS: 0.6 10*3/uL (ref 0.1–1.0)
Monocytes Relative: 6.7 % (ref 3.0–12.0)
NEUTROS ABS: 6.5 10*3/uL (ref 1.4–7.7)
Neutrophils Relative %: 76.7 % (ref 43.0–77.0)
PLATELETS: 196 10*3/uL (ref 150.0–400.0)
RBC: 4.34 Mil/uL (ref 3.87–5.11)
RDW: 13.6 % (ref 11.5–14.6)
WBC: 8.5 10*3/uL (ref 4.5–10.5)

## 2013-06-19 LAB — POCT URINE PREGNANCY: PREG TEST UR: NEGATIVE

## 2013-06-19 LAB — H. PYLORI ANTIBODY, IGG: H Pylori IgG: NEGATIVE

## 2013-06-19 LAB — LIPASE: LIPASE: 26 U/L (ref 11.0–59.0)

## 2013-06-19 MED ORDER — PROMETHAZINE HCL 12.5 MG PO TABS: ORAL_TABLET | ORAL | Status: DC

## 2013-06-19 NOTE — Progress Notes (Signed)
Pre visit review using our clinic review tool, if applicable. No additional management support is needed unless otherwise documented below in the visit note. 

## 2013-06-19 NOTE — Progress Notes (Addendum)
OFFICE NOTE  06/19/2013  CC:  Chief Complaint  Patient presents with  . Nausea    x 1 week  . Generalized Body Aches     HPI: Patient is a 28 y.o. Caucasian female who is here for nausea/body aches. Nauseated x 1 week, feeling swimmy headed, also blurry vision.  These sx's are intermittent. No vomiting.  Eating/drinking fairly normal despite her sx's.   Minimal caffeine intake.  Mid-day time period seems to be the worst.  No stomach ache/pain. Sx's not clearly related to food ingestion/postprandial.  No fever.  No urinary complaints.  No vag bleeding. No vag d/c.  LMP 06/05/13. +Headaches x 2-3 days in the last week; takes excedrin tension-relief and this helps. Doesn't seem triggered by anything or relieved by anything.  +currently sexually active and uses condoms. ? Question of extra thirst, no excessive urination   Pertinent PMH:  Past medical, surgical, social, and family history reviewed and no changes are noted since last office visit.  MEDS:  Outpatient Prescriptions Prior to Visit  Medication Sig Dispense Refill  . albuterol (VENTOLIN HFA) 108 (90 BASE) MCG/ACT inhaler Inhale 2 puffs into the lungs every 4 (four) hours as needed for wheezing.  1 Inhaler  2  . budesonide (PULMICORT FLEXHALER) 180 MCG/ACT inhaler Inhale 1 puff into the lungs 2 (two) times daily.  1 Inhaler  6  . clonazePAM (KLONOPIN) 1 MG tablet Take 1 tablet (1 mg total) by mouth 2 (two) times daily as needed for anxiety.  20 tablet  1  . ferrous sulfate 325 (65 FE) MG EC tablet Take 325 mg by mouth 2 (two) times daily.      Marland Kitchen omeprazole (PRILOSEC) 20 MG capsule Take 1 capsule (20 mg total) by mouth daily.  30 capsule  5  . fluticasone (FLONASE) 50 MCG/ACT nasal spray Place 2 sprays into both nostrils daily.  16 g  0  . PARoxetine (PAXIL) 20 MG tablet 1 tab po qhs  30 tablet  6   No facility-administered medications prior to visit.    PE: Blood pressure 124/79, pulse 76, temperature 99.2 F (37.3  C), temperature source Temporal, resp. rate 16, height 5\' 9"  (1.753 m), weight 125 lb (56.7 kg), last menstrual period 06/05/2013, SpO2 99.00%. Gen: Alert, well appearing.  Patient is oriented to person, place, time, and situation. AFFECT: pleasant, lucid thought and speech. ENT: Ears: EACs clear, normal epithelium.  TMs with good light reflex and landmarks bilaterally.  Eyes: no injection, icteris, swelling, or exudate.  EOMI, PERRLA. Nose: no drainage or turbinate edema/swelling.  No injection or focal lesion.  Mouth: lips without lesion/swelling.  Oral mucosa pink and moist.  Dentition intact and without obvious caries or gingival swelling.  Oropharynx without erythema, exudate, or swelling.  Neck: supple/nontender.  No LAD, mass, or TM.   CV: RRR, no m/r/g.   LUNGS: CTA bilat, nonlabored resps, good aeration in all lung fields. ABD: soft, NT, ND, BS normal.  No hepatospenomegaly or mass.  No bruits. EXT: no clubbing, cyanosis, or edema.  Musculoskeletal: no joint swelling, erythema, warmth, or tenderness.  ROM of all joints intact. Skin - no sores or suspicious lesions or rashes or color changes Neuro: CN 2-12 intact bilaterally, strength 5/5 in proximal and distal upper extremities and lower extremities bilaterally.  No tremor.  No disdiadochokinesis.  No ataxia.  Upper extremity and lower extremity DTRs symmetric.  No pronator drift.  LAB: Urine beta-HCG neg today  IMPRESSION AND PLAN:  Several  weeks of waxing/waning GI complaints, with lightheadedness/dizziness. Again, normal exam.  Pt with lots of questions about specific diseases she is worried about (esp diabetes).  Reassured pt. Will check CBC, CMET, Lipase, and H pylori IgG ab. Will check complete abd ultrasound as well. Phenergan rx'd for symptom control.  An After Visit Summary was printed and given to the patient.  FOLLOW UP: 1 wk

## 2013-06-20 ENCOUNTER — Ambulatory Visit (HOSPITAL_COMMUNITY)
Admission: RE | Admit: 2013-06-20 | Discharge: 2013-06-20 | Disposition: A | Discharge status: Home / Self Care | Payer: Self-pay | Source: Ambulatory Visit | Attending: Family Medicine | Admitting: Family Medicine

## 2013-06-20 ENCOUNTER — Telehealth: Payer: Self-pay | Admitting: Family Medicine

## 2013-06-20 DIAGNOSIS — R110 Nausea: Secondary | ICD-10-CM | POA: Insufficient documentation

## 2013-06-20 NOTE — Telephone Encounter (Signed)
Patient is checking to see if US results are available. Please call.

## 2013-06-20 NOTE — Telephone Encounter (Signed)
Patient aware of results.

## 2013-06-30 DIAGNOSIS — R110 Nausea: Secondary | ICD-10-CM | POA: Insufficient documentation

## 2013-06-30 DIAGNOSIS — R5381 Other malaise: Secondary | ICD-10-CM | POA: Insufficient documentation

## 2013-06-30 DIAGNOSIS — R5383 Other fatigue: Secondary | ICD-10-CM

## 2013-06-30 DIAGNOSIS — R42 Dizziness and giddiness: Secondary | ICD-10-CM | POA: Insufficient documentation

## 2013-07-04 ENCOUNTER — Ambulatory Visit (INDEPENDENT_AMBULATORY_CARE_PROVIDER_SITE_OTHER): Payer: Self-pay | Admitting: Family Medicine

## 2013-07-04 ENCOUNTER — Encounter: Payer: Self-pay | Admitting: Family Medicine

## 2013-07-04 VITALS — BP 110/75 | HR 68 | Temp 98.8°F | Resp 18 | Ht 69.0 in | Wt 123.0 lb

## 2013-07-04 DIAGNOSIS — R5381 Other malaise: Secondary | ICD-10-CM

## 2013-07-04 DIAGNOSIS — R110 Nausea: Secondary | ICD-10-CM

## 2013-07-04 DIAGNOSIS — R5383 Other fatigue: Principal | ICD-10-CM

## 2013-07-04 DIAGNOSIS — R42 Dizziness and giddiness: Secondary | ICD-10-CM

## 2013-07-04 NOTE — Progress Notes (Signed)
OFFICE NOTE  07/04/2013  CC:  Chief Complaint  Patient presents with  . Follow-up     HPI: Patient is a 28 y.o. Caucasian female who is here for 2 wk f/u visit for various GI complaints and intermittent dizziness.  Exams and labs have been normal. She feels completely well now, all sx's resolved.   Pertinent PMH:  Past medical, surgical, social, and family history reviewed and no changes are noted since last office visit.  MEDS:  Outpatient Prescriptions Prior to Visit  Medication Sig Dispense Refill  . albuterol (VENTOLIN HFA) 108 (90 BASE) MCG/ACT inhaler Inhale 2 puffs into the lungs every 4 (four) hours as needed for wheezing.  1 Inhaler  2  . budesonide (PULMICORT FLEXHALER) 180 MCG/ACT inhaler Inhale 1 puff into the lungs 2 (two) times daily.  1 Inhaler  6  . clonazePAM (KLONOPIN) 1 MG tablet Take 1 tablet (1 mg total) by mouth 2 (two) times daily as needed for anxiety.  20 tablet  1  . ferrous sulfate 325 (65 FE) MG EC tablet Take 325 mg by mouth 2 (two) times daily.      Marland Kitchen. omeprazole (PRILOSEC) 20 MG capsule Take 1 capsule (20 mg total) by mouth daily.  30 capsule  5  . fluticasone (FLONASE) 50 MCG/ACT nasal spray Place 2 sprays into both nostrils daily.  16 g  0  . PARoxetine (PAXIL) 20 MG tablet 1 tab po qhs  30 tablet  6  . promethazine (PHENERGAN) 12.5 MG tablet 1-2 tabs q 6 hr prn for nausea  30 tablet  3   No facility-administered medications prior to visit.    PE: Blood pressure 110/75, pulse 68, temperature 98.8 F (37.1 C), temperature source Temporal, resp. rate 18, height 5\' 9"  (1.753 m), weight 123 lb (55.792 kg), last menstrual period 07/03/2013, SpO2 100.00%. Gen: Alert, well appearing.  Patient is oriented to person, place, time, and situation. AFFECT: pleasant, lucid thought and speech. No further exam today.  IMPRESSION AND PLAN:  Reassured pt, all of her physical symptoms seem to be transient and likely anxiety-related. All recent blood tests and  imaging (abd u/s) normal.  An After Visit Summary was printed and given to the patient.   FOLLOW UP: prn

## 2013-07-04 NOTE — Progress Notes (Signed)
Pre visit review using our clinic review tool, if applicable. No additional management support is needed unless otherwise documented below in the visit note. 

## 2013-08-26 ENCOUNTER — Ambulatory Visit (INDEPENDENT_AMBULATORY_CARE_PROVIDER_SITE_OTHER): Payer: Self-pay | Admitting: Nurse Practitioner

## 2013-08-26 ENCOUNTER — Encounter: Payer: Self-pay | Admitting: Nurse Practitioner

## 2013-08-26 VITALS — BP 135/88 | HR 81 | Temp 98.2°F | Resp 18 | Ht 69.0 in | Wt 117.0 lb

## 2013-08-26 DIAGNOSIS — J329 Chronic sinusitis, unspecified: Secondary | ICD-10-CM

## 2013-08-26 MED ORDER — AMOXICILLIN-POT CLAVULANATE 875-125 MG PO TABS: 1.0000 | ORAL_TABLET | Freq: Two times a day (BID) | ORAL | Status: DC

## 2013-08-26 NOTE — Patient Instructions (Signed)
Start antibiotic. Eat yogurt daily at lunch or afternoon to help prevent diarrhea that can be caused by antibiotic. Start daily sinus rinses (Neilmed Sinus rinse) for at least 5-7 days. You may use pseudoephedrine 30 mg twice daily for 4-6 days. Use tylenol for headache. Please call for re-evaluation if you are not improving.   Sinusitis Sinusitis is redness, soreness, and swelling (inflammation) of the paranasal sinuses. Paranasal sinuses are air pockets within the bones of your face (beneath the eyes, the middle of the forehead, or above the eyes). In healthy paranasal sinuses, mucus is able to drain out, and air is able to circulate through them by way of your nose. However, when your paranasal sinuses are inflamed, mucus and air can become trapped. This can allow bacteria and other germs to grow and cause infection. Sinusitis can develop quickly and last only a short time (acute) or continue over a long period (chronic). Sinusitis that lasts for more than 12 weeks is considered chronic.  CAUSES  Causes of sinusitis include:  Allergies.  Structural abnormalities, such as displacement of the cartilage that separates your nostrils (deviated septum), which can decrease the air flow through your nose and sinuses and affect sinus drainage.  Functional abnormalities, such as when the small hairs (cilia) that line your sinuses and help remove mucus do not work properly or are not present. SYMPTOMS  Symptoms of acute and chronic sinusitis are the same. The primary symptoms are pain and pressure around the affected sinuses. Other symptoms include:  Upper toothache.  Earache.  Headache.  Bad breath.  Decreased sense of smell and taste.  A cough, which worsens when you are lying flat.  Fatigue.  Fever.  Thick drainage from your nose, which often is green and may contain pus (purulent).  Swelling and warmth over the affected sinuses. DIAGNOSIS  Your caregiver will perform a physical exam.  During the exam, your caregiver may:  Look in your nose for signs of abnormal growths in your nostrils (nasal polyps).  Tap over the affected sinus to check for signs of infection.  View the inside of your sinuses (endoscopy) with a special imaging device with a light attached (endoscope), which is inserted into your sinuses. If your caregiver suspects that you have chronic sinusitis, one or more of the following tests may be recommended:  Allergy tests.  Nasal culture A sample of mucus is taken from your nose and sent to a lab and screened for bacteria.  Nasal cytology A sample of mucus is taken from your nose and examined by your caregiver to determine if your sinusitis is related to an allergy. TREATMENT  Most cases of acute sinusitis are related to a viral infection and will resolve on their own within 10 days. Sometimes medicines are prescribed to help relieve symptoms (pain medicine, decongestants, nasal steroid sprays, or saline sprays).  However, for sinusitis related to a bacterial infection, your caregiver will prescribe antibiotic medicines. These are medicines that will help kill the bacteria causing the infection.  Rarely, sinusitis is caused by a fungal infection. In theses cases, your caregiver will prescribe antifungal medicine. For some cases of chronic sinusitis, surgery is needed. Generally, these are cases in which sinusitis recurs more than 3 times per year, despite other treatments. HOME CARE INSTRUCTIONS   Drink plenty of water. Water helps thin the mucus so your sinuses can drain more easily.  Use a humidifier.  Inhale steam 3 to 4 times a day (for example, sit in the bathroom  with the shower running).  Apply a warm, moist washcloth to your face 3 to 4 times a day, or as directed by your caregiver.  Use saline nasal sprays to help moisten and clean your sinuses.  Take over-the-counter or prescription medicines for pain, discomfort, or fever only as directed by  your caregiver. SEEK IMMEDIATE MEDICAL CARE IF:  You have increasing pain or severe headaches.  You have nausea, vomiting, or drowsiness.  You have swelling around your face.  You have vision problems.  You have a stiff neck.  You have difficulty breathing. MAKE SURE YOU:   Understand these instructions.  Will watch your condition.  Will get help right away if you are not doing well or get worse. Document Released: 03/13/2005 Document Revised: 06/05/2011 Document Reviewed: 03/28/2011 [facility]Exit[facility] Patient Information 2014 [employer], [place].

## 2013-08-26 NOTE — Progress Notes (Signed)
   Subjective:    Patient ID: Janet Hall, female    DOB: 27-Jun-1985, 27 y.o.   MRN: [id number]  Sinusitis This is a new problem. The current episode started 1 to 4 weeks ago (2wks). The problem is unchanged. There has been no fever. The pain is moderate. Associated symptoms include congestion, coughing, headaches, sinus pressure and a sore throat. Pertinent negatives include no chills, shortness of breath or sneezing. Treatments tried: tylenol cold & flu, nasacort. The treatment provided no relief.      Review of Systems  Constitutional: Negative for fever, chills, activity change, appetite change and fatigue.  HENT: Positive for congestion, postnasal drip, sinus pressure and sore throat. Negative for sneezing and voice change.   Respiratory: Positive for cough. Negative for chest tightness, shortness of breath and wheezing.   Cardiovascular: Negative for chest pain.  Musculoskeletal: Negative for back pain.  Neurological: Positive for headaches. Negative for dizziness.  Psychiatric/Behavioral: Negative for confusion.       Objective:   Physical Exam  Vitals reviewed. Constitutional: She is oriented to person, place, and time. She appears well-developed and well-nourished. No distress.  HENT:  Head: Normocephalic and atraumatic.  Right Ear: External ear normal.  Left Ear: External ear normal.  Mouth/Throat: No oropharyngeal exudate.  bilat retraction TM, bones visible. Posterior pharynx mildly erythematous  Eyes: Conjunctivae are normal. Right eye exhibits no discharge. Left eye exhibits no discharge.  Neck: Normal range of motion. Neck supple. No thyromegaly present.  Cardiovascular: Normal rate, regular rhythm and normal heart sounds.   No murmur heard. Pulmonary/Chest: Effort normal and breath sounds normal. No respiratory distress. She has no wheezes. She has no rales.  Lymphadenopathy:    She has no cervical adenopathy.  Neurological: She is alert and oriented to person,  place, and time.  Skin: Skin is warm and dry.  Psychiatric: She has a normal mood and affect. Her behavior is normal. Thought content normal.          Assessment & Plan:  1. Sinusitis - amoxicillin-clavulanate (AUGMENTIN) 875-125 MG per tablet; Take 1 tablet by mouth 2 (two) times daily.  Dispense: 10 tablet; Refill: 0 Sinus rinse pseudoephedrine See pt instructions.

## 2013-08-27 ENCOUNTER — Ambulatory Visit: Payer: Self-pay | Admitting: Family Medicine

## 2013-09-01 ENCOUNTER — Telehealth: Payer: Self-pay | Admitting: *Deleted

## 2013-09-01 NOTE — Telephone Encounter (Signed)
If she has not started sinus rinse, she should. If this is viral , antibiotics will not help. She should schedule follow up if no improvement.

## 2013-09-01 NOTE — Telephone Encounter (Signed)
Pt left vm stating that she is not any better then she was 08/26/2013. Pt also stated that she has completed the antibiotic that was prescribed. Patient said her head still hurts and she has green mucus. Patient would like to know what else she can try?

## 2013-09-02 NOTE — Telephone Encounter (Signed)
Patient was notified. Patient stated that she will symptoms a little more time and if not better she will call for f/u ov.

## 2013-10-10 ENCOUNTER — Ambulatory Visit: Payer: Self-pay | Admitting: Family Medicine

## 2013-10-10 ENCOUNTER — Ambulatory Visit (INDEPENDENT_AMBULATORY_CARE_PROVIDER_SITE_OTHER): Payer: Self-pay | Admitting: Family Medicine

## 2013-10-10 ENCOUNTER — Encounter: Payer: Self-pay | Admitting: Family Medicine

## 2013-10-10 VITALS — BP 113/82 | HR 85 | Temp 99.0°F | Resp 16 | Ht 69.0 in | Wt 114.0 lb

## 2013-10-10 DIAGNOSIS — H9209 Otalgia, unspecified ear: Secondary | ICD-10-CM

## 2013-10-10 DIAGNOSIS — H9203 Otalgia, bilateral: Secondary | ICD-10-CM

## 2013-10-10 DIAGNOSIS — J029 Acute pharyngitis, unspecified: Secondary | ICD-10-CM

## 2013-10-10 DIAGNOSIS — J069 Acute upper respiratory infection, unspecified: Secondary | ICD-10-CM

## 2013-10-10 NOTE — Progress Notes (Signed)
Pre visit review using our clinic review tool, if applicable. No additional management support is needed unless otherwise documented below in the visit note. 

## 2013-10-10 NOTE — Progress Notes (Signed)
OFFICE NOTE  10/10/2013  CC:  Chief Complaint  Patient presents with  . Otalgia    x 1 weeks  . Sore Throat   HPI: Patient is a 28 y.o. Caucasian female who is here for ear pain and ST.   Vague description of pressure in throat, no ST or pain or burning.  EArs hurt some. All started about 5d ago. No fever.  Little runny nose.  No cough.  +PND.   Pertinent PMH:  Past Medical History  Diagnosis Date  . Migraine without aura     Onset in early teens.  Anywhere from 2 per week to 1 per month.    Marland Kitchen. GAD (generalized anxiety disorder) 11/21/2011  . Asthma, mild persistent   . Eustachian tube dysfunction     Otalgia (ED visit 01/2013)   History reviewed. No pertinent past surgical history.  MEDS:  Omeprazole 20mg  qd  PE: Blood pressure 113/82, pulse 85, temperature 99 F (37.2 C), temperature source Temporal, resp. rate 16, height 5\' 9"  (1.753 m), weight 114 lb (51.71 kg), SpO2 98.00%. VS: noted--normal. Gen: alert, NAD, NONTOXIC APPEARING. HEENT: eyes without injection, drainage, or swelling.  Ears: EACs clear, TMs with normal light reflex and landmarks, slight retraction of TM on right, mild bulge on left.  NO middle ear fluid, no TM erythema..  Nose: Clear rhinorrhea, with some dried, crusty exudate adherent to mildly injected mucosa.  No purulent d/c.  No paranasal sinus TTP.  No facial swelling.  Throat and mouth without focal lesion.  No pharyngial swelling, erythema, or exudate.   Neck: supple, no LAD or TM or tenderness. LUNGS: CTA bilat, nonlabored resps.   CV: RRR, no m/r/g. EXT: no c/c/e SKIN: no rash  LAB: none  IMPRESSION AND PLAN:  URI vs allergic rhinitis with eustachian tube dysfunction. Reassured pt that all looks fine on exam.  She is hypochondriacal and readily admits this, says she always thinks she has thyroid cancer or esophageal cancer or "the worse dx possible". I reassured her over and over today.  GERD could be contributing to the mucous collection in  throat: advised GER diet and 1-2 wk course of H2 blocker in addition to her daily omeprazole.  She says her anxiety med has made her only feel weird in the past and she has stopped taking it, not clear if she ever took it consistently enough to help. She asked about this today and I told her I honestly felt like the best option for her was to see a psychiatrist and/or counselor about this.  I don't want to start any new meds with her going on her honeymoon in a few days. She expressed understanding and will contact our office when she feels like she can go to psychiatrist (when she gets health insurance).  An After Visit Summary was printed and given to the patient.  FOLLOW UP: prn

## 2013-10-10 NOTE — Addendum Note (Signed)
Addended by: Cydney OkAUGUSTIN, Maxmillian Carsey N on: 10/10/2013 01:59 PM   Modules accepted: Orders

## 2013-11-25 ENCOUNTER — Telehealth: Payer: Self-pay | Admitting: Family Medicine

## 2013-11-25 MED ORDER — OMEPRAZOLE 20 MG PO CPDR: 20.0000 mg | DELAYED_RELEASE_CAPSULE | Freq: Every day | ORAL | Status: DC

## 2013-11-25 NOTE — Telephone Encounter (Signed)
Rx sent to [facility], [place] # [phone] x 3 rfs.

## 2014-01-01 ENCOUNTER — Other Ambulatory Visit: Payer: Self-pay | Admitting: Obstetrics and Gynecology

## 2014-01-01 DIAGNOSIS — O3680X0 Pregnancy with inconclusive fetal viability, not applicable or unspecified: Secondary | ICD-10-CM

## 2014-01-02 ENCOUNTER — Other Ambulatory Visit: Payer: Self-pay | Admitting: Adult Health

## 2014-01-02 ENCOUNTER — Ambulatory Visit (INDEPENDENT_AMBULATORY_CARE_PROVIDER_SITE_OTHER): Payer: Medicaid Other

## 2014-01-02 DIAGNOSIS — O3680X0 Pregnancy with inconclusive fetal viability, not applicable or unspecified: Secondary | ICD-10-CM

## 2014-01-02 MED ORDER — DOXYLAMINE-PYRIDOXINE 10-10 MG PO TBEC: DELAYED_RELEASE_TABLET | ORAL | Status: DC

## 2014-01-02 NOTE — Progress Notes (Signed)
U/S(8+6wks)-single IUP with +FCA noted, CRL c/w LMP dates, cx appears closed, bilateral adnexa appears WNL with C.L. Noted on LT, FHR-180 bpm

## 2014-01-13 ENCOUNTER — Encounter: Payer: Self-pay | Admitting: Women's Health

## 2014-01-13 ENCOUNTER — Ambulatory Visit (INDEPENDENT_AMBULATORY_CARE_PROVIDER_SITE_OTHER): Payer: Medicaid Other | Admitting: Women's Health

## 2014-01-13 VITALS — BP 112/66 | Wt 125.0 lb

## 2014-01-13 DIAGNOSIS — Z1159 Encounter for screening for other viral diseases: Secondary | ICD-10-CM

## 2014-01-13 DIAGNOSIS — Z331 Pregnant state, incidental: Secondary | ICD-10-CM

## 2014-01-13 DIAGNOSIS — Z0184 Encounter for antibody response examination: Secondary | ICD-10-CM

## 2014-01-13 DIAGNOSIS — Z0283 Encounter for blood-alcohol and blood-drug test: Secondary | ICD-10-CM

## 2014-01-13 DIAGNOSIS — Z114 Encounter for screening for human immunodeficiency virus [HIV]: Secondary | ICD-10-CM

## 2014-01-13 DIAGNOSIS — Z3400 Encounter for supervision of normal first pregnancy, unspecified trimester: Secondary | ICD-10-CM | POA: Insufficient documentation

## 2014-01-13 DIAGNOSIS — Z1389 Encounter for screening for other disorder: Secondary | ICD-10-CM

## 2014-01-13 DIAGNOSIS — Z113 Encounter for screening for infections with a predominantly sexual mode of transmission: Secondary | ICD-10-CM

## 2014-01-13 DIAGNOSIS — Z803 Family history of malignant neoplasm of breast: Secondary | ICD-10-CM | POA: Insufficient documentation

## 2014-01-13 DIAGNOSIS — Z3682 Encounter for antenatal screening for nuchal translucency: Secondary | ICD-10-CM

## 2014-01-13 DIAGNOSIS — Z3401 Encounter for supervision of normal first pregnancy, first trimester: Secondary | ICD-10-CM

## 2014-01-13 DIAGNOSIS — Z118 Encounter for screening for other infectious and parasitic diseases: Secondary | ICD-10-CM

## 2014-01-13 LAB — CBC
HEMATOCRIT: 38.9 % (ref 36.0–46.0)
HEMOGLOBIN: 13.4 g/dL (ref 12.0–15.0)
MCH: 29.8 pg (ref 26.0–34.0)
MCHC: 34.4 g/dL (ref 30.0–36.0)
MCV: 86.4 fL (ref 78.0–100.0)
Platelets: 252 10*3/uL (ref 150–400)
RBC: 4.5 MIL/uL (ref 3.87–5.11)
RDW: 13.6 % (ref 11.5–15.5)
WBC: 9.7 10*3/uL (ref 4.0–10.5)

## 2014-01-13 LAB — POCT URINALYSIS DIPSTICK
Blood, UA: NEGATIVE
Glucose, UA: NEGATIVE
KETONES UA: NEGATIVE
Leukocytes, UA: NEGATIVE
Nitrite, UA: NEGATIVE
Protein, UA: NEGATIVE

## 2014-01-13 MED ORDER — DOXYLAMINE-PYRIDOXINE 10-10 MG PO TBEC: DELAYED_RELEASE_TABLET | ORAL | Status: DC

## 2014-01-13 NOTE — Progress Notes (Addendum)
Subjective:  Janet Hall is a 28 y.o. G1P0 Caucasian female at 4786w3d by LMP c/w 9wk u/s, being seen today for her first obstetrical visit.  Her obstetrical history is significant for primigravida.  Pregnancy history fully reviewed. H/O anxiety, more over medical issues/concerns, leans towards hypochondriacism per PCP note. Had rx for klonopin and paxil in past, but made her feel weird, so she didn't really take them. H/O asthma, inhaler occ.   Patient reports nausea/vomiting- diclegis helping some, thinks it's more from postnasal drainage- claritin really hasn't helped- can try zyrtec. Some ha's. Denies vb, cramping, uti s/s, abnormal/malodorous vag d/c, or vulvovaginal itching/irritation.  BP 112/66  Wt 125 lb (56.7 kg)  LMP 11/01/2013  HISTORY: OB History  Gravida Para Term Preterm AB SAB TAB Ectopic Multiple Living  1             # Outcome Date GA Lbr Len/2nd Weight Sex Delivery Anes PTL Lv  1 CUR              Past Medical History  Diagnosis Date  . Migraine without aura     Onset in early teens.  Anywhere from 2 per week to 1 per month.    Marland Kitchen. GAD (generalized anxiety disorder) 11/21/2011  . Asthma, mild persistent   . Eustachian tube dysfunction     Otalgia (ED visit 01/2013)   History reviewed. No pertinent past surgical history. Family History  Problem Relation Age of Onset  . Arthritis Mother   . Cancer Mother [id number]    breast  . Hypertension Father   . Arthritis Maternal Grandmother     liver cancer  . Arthritis Maternal Grandfather     stomach cancer    Exam   System:     General: Well developed & nourished, no acute distress   Skin: Warm & dry, normal coloration and turgor, no rashes   Neurologic: Alert & oriented, normal mood   Cardiovascular: Regular rate & rhythm   Respiratory: Effort & rate normal, LCTAB, acyanotic   Abdomen: Soft, non tender   Extremities: normal strength, tone   Pelvic Exam:    Perineum: Normal perineum   Vulva: Normal, no lesions    Vagina:  Normal mucosa, normal discharge   Cervix: Normal, bulbous, appears closed   Uterus: Normal size/shape/contour for GA   Thin prep pap smear neg 2014 @ [facility] per pt  FHR: 170 via doppler   Assessment:   Pregnancy: G1P0 Patient Active Problem List   Diagnosis Date Noted  . Supervision of normal first pregnancy 01/13/2014    Priority: High  . Chest wall pain 05/21/2013  . Irritant contact dermatitis 01/10/2013  . Asthma with acute exacerbation 12/17/2012  . Asthma, mild persistent   . History of iron deficiency anemia 06/19/2012  . Asthma, chronic 06/05/2012  . Cervical cancer screening 11/21/2011  . GAD (generalized anxiety disorder) 11/21/2011  . Migraine headache without aura 11/02/2011    6686w3d G1P0 New OB visit Nausea/vomiting of pregnancy  Postnasal drainage H/O asthma H/O anxiety of medical issues/health H/O mother dx w/ breast CA @ 56yo, still living   Plan:  Initial labs drawn Continue prenatal vitamins Problem list reviewed and updated Reviewed n/v relief measures and warning s/s to report Rx diclegis and prior auth sent Reviewed recommended weight gain based on pre-gravid BMI Encouraged well-balanced diet Genetic Screening discussed Integrated Screen: undecided Cystic fibrosis screening discussed requested Ultrasound discussed; fetal survey: requested Follow up in 2 weeks for 1st it/nt and  visit (will go ahead and schedule, if decides against nt/it, to call and cancel) CCNC completed Offered NFPartnership, declined Declined flu shot [facility]  Marge DuncansBooker, Janet Karim Randall CNM, [place] [facility] WestWHNP-BC 01/13/2014 3:52 PM

## 2014-01-13 NOTE — Patient Instructions (Signed)

## 2014-01-14 ENCOUNTER — Encounter: Payer: Self-pay | Admitting: Women's Health

## 2014-01-14 LAB — DRUG SCREEN, URINE, NO CONFIRMATION
Amphetamine Screen, Ur: NEGATIVE
Barbiturate Quant, Ur: NEGATIVE
Benzodiazepines.: NEGATIVE
CREATININE, U: 227.3 mg/dL
Cocaine Metabolites: NEGATIVE
Marijuana Metabolite: NEGATIVE
Methadone: NEGATIVE
Opiate Screen, Urine: NEGATIVE
Phencyclidine (PCP): NEGATIVE
Propoxyphene: NEGATIVE

## 2014-01-14 LAB — GC/CHLAMYDIA PROBE AMP
CT PROBE, AMP APTIMA: NEGATIVE
GC Probe RNA: NEGATIVE

## 2014-01-14 LAB — URINALYSIS, ROUTINE W REFLEX MICROSCOPIC
Bilirubin Urine: NEGATIVE
Glucose, UA: NEGATIVE mg/dL
Hgb urine dipstick: NEGATIVE
KETONES UR: NEGATIVE mg/dL
Leukocytes, UA: NEGATIVE
NITRITE: NEGATIVE
Protein, ur: NEGATIVE mg/dL
Specific Gravity, Urine: 1.021 (ref 1.005–1.030)
UROBILINOGEN UA: 0.2 mg/dL (ref 0.0–1.0)
pH: 5 (ref 5.0–8.0)

## 2014-01-14 LAB — ABO AND RH: Rh Type: POSITIVE

## 2014-01-14 LAB — HIV ANTIBODY (ROUTINE TESTING W REFLEX): HIV: NONREACTIVE

## 2014-01-14 LAB — ANTIBODY SCREEN: ANTIBODY SCREEN: NEGATIVE

## 2014-01-14 LAB — RPR

## 2014-01-14 LAB — OXYCODONE SCREEN, UA, RFLX CONFIRM: OXYCODONE SCRN UR: NEGATIVE ng/mL

## 2014-01-14 LAB — VARICELLA ZOSTER ANTIBODY, IGG: VARICELLA IGG: 677.9 {index} — AB (ref <135.00)

## 2014-01-14 LAB — HEPATITIS B SURFACE ANTIGEN: HEP B S AG: NEGATIVE

## 2014-01-14 LAB — RUBELLA SCREEN: RUBELLA: 2.83 {index} — AB (ref <0.90)

## 2014-01-15 LAB — URINE CULTURE
Colony Count: NO GROWTH
Organism ID, Bacteria: NO GROWTH

## 2014-01-20 ENCOUNTER — Telehealth: Payer: Self-pay | Admitting: Obstetrics & Gynecology

## 2014-01-20 NOTE — Telephone Encounter (Signed)
Pt c/o right sided abdominal pain, no vaginal bleeding, dizzy spells, shortness of breath. Pt states has asthma and is using inhaler. Pt encouraged to push fluids, take tylenol if no improvement to call our office back. Pt verbalized understanding and states has an appt next Tuesday.

## 2014-01-22 ENCOUNTER — Telehealth: Payer: Self-pay | Admitting: Adult Health

## 2014-01-22 NOTE — Telephone Encounter (Signed)
Spoke with pt. Pt scratched her arm on her locker today. The locker had some rust on it. Pt hasn't had a tetanus shot since [employer]. I spoke with Drenda FreezeFran, CNM and she recommends pt can [facility]. Pt voiced understanding.  Order faxed to [phone]) for Tdap, attn Trena PlattJennifer Love, RN or Gennaro AfricaSandra Moore per FCD, CNM. JSY

## 2014-01-26 ENCOUNTER — Encounter: Payer: Self-pay | Admitting: Women's Health

## 2014-01-27 ENCOUNTER — Ambulatory Visit (INDEPENDENT_AMBULATORY_CARE_PROVIDER_SITE_OTHER): Payer: Medicaid Other

## 2014-01-27 ENCOUNTER — Ambulatory Visit (INDEPENDENT_AMBULATORY_CARE_PROVIDER_SITE_OTHER): Payer: Medicaid Other | Admitting: Advanced Practice Midwife

## 2014-01-27 VITALS — BP 112/70 | Wt 130.0 lb

## 2014-01-27 DIAGNOSIS — Z1389 Encounter for screening for other disorder: Secondary | ICD-10-CM

## 2014-01-27 DIAGNOSIS — Z331 Pregnant state, incidental: Secondary | ICD-10-CM

## 2014-01-27 DIAGNOSIS — Z3682 Encounter for antenatal screening for nuchal translucency: Secondary | ICD-10-CM

## 2014-01-27 DIAGNOSIS — Z36 Encounter for antenatal screening of mother: Secondary | ICD-10-CM

## 2014-01-27 DIAGNOSIS — Z3401 Encounter for supervision of normal first pregnancy, first trimester: Secondary | ICD-10-CM

## 2014-01-27 LAB — POCT URINALYSIS DIPSTICK
Glucose, UA: NEGATIVE
KETONES UA: NEGATIVE
Leukocytes, UA: NEGATIVE
Nitrite, UA: NEGATIVE
Protein, UA: NEGATIVE
RBC UA: NEGATIVE

## 2014-01-27 MED ORDER — OMEPRAZOLE 20 MG PO CPDR: 20.0000 mg | DELAYED_RELEASE_CAPSULE | Freq: Every day | ORAL | Status: DC

## 2014-01-27 MED ORDER — ALBUTEROL SULFATE HFA 108 (90 BASE) MCG/ACT IN AERS: 2.0000 | INHALATION_SPRAY | RESPIRATORY_TRACT | Status: DC | PRN

## 2014-01-27 NOTE — Progress Notes (Signed)
U/S(12+3wks)-single active fetus CRL c/w dates, fluid WNL, anterior Gr 0 placenta, cx appears closed (3.1cm), bilateral adnexa appears WNL, NB present, NT-1.8187mm, FHR-157 bpm

## 2014-01-27 NOTE — Progress Notes (Signed)
G1P0 3596w3d Estimated Date of Delivery: 08/08/14  Last menstrual period 11/01/2013.   BP weight and urine results all reviewed and noted.  Please refer to the obstetrical flow sheet for the fundal height and fetal heart rate documentation:Had NT/IT today. All normal so far  Patient  denies any bleeding and no rupture of membranes symptoms or regular contractions. Patient has mild asthma. Doesn't have to use inhaler often.  Today feels a little more short of breath.  LCTAB.  Will refill inhaler All questions were answered.  Plan:  Continued routine obstetrical care,   Follow up in 4 weeks for OB appointment, 2nd IT

## 2014-01-31 LAB — MATERNAL SCREEN, INTEGRATED #1

## 2014-02-24 ENCOUNTER — Ambulatory Visit (INDEPENDENT_AMBULATORY_CARE_PROVIDER_SITE_OTHER): Payer: Medicaid Other | Admitting: Women's Health

## 2014-02-24 ENCOUNTER — Encounter: Payer: Self-pay | Admitting: Women's Health

## 2014-02-24 VITALS — BP 104/64 | Wt 131.0 lb

## 2014-02-24 DIAGNOSIS — Z3682 Encounter for antenatal screening for nuchal translucency: Secondary | ICD-10-CM

## 2014-02-24 DIAGNOSIS — Z331 Pregnant state, incidental: Secondary | ICD-10-CM

## 2014-02-24 DIAGNOSIS — Z1389 Encounter for screening for other disorder: Secondary | ICD-10-CM

## 2014-02-24 DIAGNOSIS — Z363 Encounter for antenatal screening for malformations: Secondary | ICD-10-CM

## 2014-02-24 DIAGNOSIS — Z3402 Encounter for supervision of normal first pregnancy, second trimester: Secondary | ICD-10-CM

## 2014-02-24 LAB — POCT URINALYSIS DIPSTICK
GLUCOSE UA: NEGATIVE
Ketones, UA: NEGATIVE
Leukocytes, UA: NEGATIVE
Nitrite, UA: NEGATIVE
Protein, UA: NEGATIVE
RBC UA: NEGATIVE

## 2014-02-24 MED ORDER — BUTALBITAL-APAP-CAFFEINE 50-325-40 MG PO TABS: 1.0000 | ORAL_TABLET | ORAL | Status: DC | PRN

## 2014-02-24 MED ORDER — PROMETHAZINE HCL 12.5 MG RE SUPP: 12.5000 mg | Freq: Four times a day (QID) | RECTAL | Status: DC | PRN

## 2014-02-24 NOTE — Progress Notes (Signed)
Low-risk OB appointment G1P0 4052w3d Estimated Date of Delivery: 08/08/14 BP 104/64 mmHg  Wt 131 lb (59.421 kg)  LMP 11/01/2013  BP, weight, and urine reviewed.  Refer to obstetrical flow sheet for FH & FHR.  No fm yet. Denies cramping, lof, vb, or uti s/s. Nausea/vomiting x last few days, also migraine- apap not helping. N/V had gotten better w/ 3 diclegis/day. Hasn't been able to eat at all today d/t n/v. Will rx phenergan suppositories & fioricet for ha.  Moist mucous membranes. To go to hospital if unable to keep any food/fluids down and gets dehydrated.  Reviewed warning s/s to report. Plan:  Continue routine obstetrical care  F/U in 4wks for OB appointment and anatomy u/s 2nd IT today

## 2014-02-28 LAB — MATERNAL SCREEN, INTEGRATED #2
AFP MoM: 0.97
AFP, Serum: 38.1 ng/mL
Calculated Gestational Age: 16.7
Crown Rump Length: 65.7 mm
Estriol Mom: 0.96
Estriol, Free: 1.01 ng/mL
INHIBIN A DIMERIC MAT SCREEN: 144 pg/mL
INHIBIN A MOM MAT SCREEN: 0.81
MSS Down Syndrome: <1:5000 {titer}
NT MoM: 1.27
Nuchal Translucency: 1.87 mm
Number of fetuses: 1
PAPP-A MOM MAT SCREEN: 2.3
PAPP-A: 1867 ng/mL
hCG MoM: 1.29
hCG, Serum: 42.4 IU/mL

## 2014-03-12 ENCOUNTER — Telehealth: Payer: Self-pay | Admitting: *Deleted

## 2014-03-12 NOTE — Telephone Encounter (Signed)
Pt c/o left side and lower stomach sharp pain and lower back pain x 1 time, no vaginal bleeding, feels "flutter." Pt informed to push fluids and rest, can take tylenol if no improvement to call office back, if vaginal bleeding, severe cramping [facility]. Pt verbalized understanding.

## 2014-03-16 ENCOUNTER — Encounter: Payer: Self-pay | Admitting: Women's Health

## 2014-03-16 ENCOUNTER — Ambulatory Visit (INDEPENDENT_AMBULATORY_CARE_PROVIDER_SITE_OTHER): Payer: Medicaid Other | Admitting: Women's Health

## 2014-03-16 VITALS — BP 110/64 | Temp 98.2°F | Wt 138.0 lb

## 2014-03-16 DIAGNOSIS — J069 Acute upper respiratory infection, unspecified: Secondary | ICD-10-CM

## 2014-03-16 DIAGNOSIS — Z1389 Encounter for screening for other disorder: Secondary | ICD-10-CM

## 2014-03-16 DIAGNOSIS — Z331 Pregnant state, incidental: Secondary | ICD-10-CM

## 2014-03-16 DIAGNOSIS — H6692 Otitis media, unspecified, left ear: Secondary | ICD-10-CM

## 2014-03-16 LAB — POCT URINALYSIS DIPSTICK
Glucose, UA: NEGATIVE
Ketones, UA: NEGATIVE
Leukocytes, UA: NEGATIVE
Nitrite, UA: NEGATIVE
Protein, UA: NEGATIVE
RBC UA: NEGATIVE

## 2014-03-16 MED ORDER — AZITHROMYCIN 250 MG PO TABS: ORAL_TABLET | ORAL | Status: DC

## 2014-03-16 NOTE — Progress Notes (Signed)
   [facility] Visit  Patient name: Janet Hall MRN [id number]  Date of birth: 17-Mar-1986  CC & HPI:  Janet Hall is a 28 y.o. Caucasian G1P0 female at 4640w2d presenting today for report of bilateral ear pain, L>R, non-productive cough, sore throat x 2-3 days. No fever/chills. Many sick contacts.  +fm, no uc's/lof/vb.  Pertinent History Reviewed:  Medical & Surgical Hx:   Past Medical History  Diagnosis Date  . Migraine without aura     Onset in early teens.  Anywhere from 2 per week to 1 per month.    Marland Kitchen. GAD (generalized anxiety disorder) 11/21/2011  . Asthma, mild persistent   . Eustachian tube dysfunction     Otalgia (ED visit 01/2013)   History reviewed. No pertinent past surgical history. Medications: Reviewed & Updated - see associated section Social History: Reviewed -  reports that she has never smoked. She has never used smokeless tobacco.  Objective Findings:  Vitals: BP 110/64 mmHg  Temp(Src) 98.2 F (36.8 C)  Wt 138 lb (62.596 kg)  LMP 11/01/2013  Physical Examination: General appearance - alert, well appearing, and in no distress Ears - right ear normal, left TM cloudy, dull, bulging Mouth - erythematous and w/o exudate  Results for orders placed or performed in visit on 03/16/14 (from the past 24 hour(s))  POCT Urinalysis Dipstick   Collection Time: 03/16/14  2:11 PM  Result Value Ref Range   Color, UA     Clarity, UA     Glucose, UA neg    Bilirubin, UA     Ketones, UA neg    Spec Grav, UA     Blood, UA neg    pH, UA     Protein, UA neg    Urobilinogen, UA     Nitrite, UA neg    Leukocytes, UA Negative      Assessment & Plan:  A:   URI w/ Lt acute otitis media  5740w2d SIUP P:  Rx zpak  Discussed other relief measures for sx   F/U 1wk as scheduled for visit and anatomy u/s   Marge DuncansBooker, Omaira Mellen Randall CNM, WHNP-BC 03/16/2014 2:30 PM

## 2014-03-16 NOTE — Patient Instructions (Signed)
Cough: cough drops, regular Robitussin Sore throat: warm salt water gargles Congestion: humidifier, saline nasal spray Chest congestion: Mucinex, lots of water

## 2014-03-24 ENCOUNTER — Ambulatory Visit (INDEPENDENT_AMBULATORY_CARE_PROVIDER_SITE_OTHER): Payer: Medicaid Other | Admitting: Advanced Practice Midwife

## 2014-03-24 ENCOUNTER — Ambulatory Visit (INDEPENDENT_AMBULATORY_CARE_PROVIDER_SITE_OTHER): Payer: Medicaid Other

## 2014-03-24 VITALS — BP 118/68 | Wt 141.0 lb

## 2014-03-24 DIAGNOSIS — Z3402 Encounter for supervision of normal first pregnancy, second trimester: Secondary | ICD-10-CM

## 2014-03-24 DIAGNOSIS — Z331 Pregnant state, incidental: Secondary | ICD-10-CM

## 2014-03-24 DIAGNOSIS — Z363 Encounter for antenatal screening for malformations: Secondary | ICD-10-CM

## 2014-03-24 DIAGNOSIS — Z36 Encounter for antenatal screening of mother: Secondary | ICD-10-CM

## 2014-03-24 DIAGNOSIS — Z1389 Encounter for screening for other disorder: Secondary | ICD-10-CM

## 2014-03-24 LAB — POCT URINALYSIS DIPSTICK
Glucose, UA: NEGATIVE
Ketones, UA: NEGATIVE
Leukocytes, UA: NEGATIVE
Nitrite, UA: NEGATIVE
Protein, UA: NEGATIVE
RBC UA: NEGATIVE

## 2014-03-24 NOTE — Progress Notes (Signed)
G1P0 713w3d Estimated Date of Delivery: 08/08/14  Last menstrual period 11/01/2013.   BP weight and urine results all reviewed and noted.  Please refer to the obstetrical flow sheet for the fundal height and fetal heart rate documentation:  Anatomy scan today:  U/S(20+3wks)-active fetus, meas c/w dates, fluid WNL, anterior Gr 0 placenta, cx appears closed ( 3.2cm), bilateral adnexa appears WNL, FHR- 155 bpm, no major abnl noted, female fetus  Patient reports good fetal movement, denies any bleeding and no rupture of membranes symptoms or regular contractions. Patient is without complaints, except used a new skincare product last night and now has rash on neck.  Has gone down since this am.  Try bendayl and hydrocortsone cream  All questions were answered.  Plan:  Continued routine obstetrical care,   Follow up in 4 weeks for OB appointment,

## 2014-03-24 NOTE — Progress Notes (Signed)
U/S(20+3wks)-active fetus, meas c/w dates, fluid WNL, anterior Gr 0 placenta, cx appears closed ( 3.2cm), bilateral adnexa appears WNL, FHR- 155 bpm, no major abnl noted, female fetus

## 2014-03-27 NOTE — L&D Delivery Note (Cosign Needed)
Delivery Note At 6:59 AM a viable and healthy female was delivered via Vaginal, Spontaneous Delivery (Presentation: ; Occiput Anterior with nuchal hand).  APGAR: 8, 9; weight  .   Placenta status: Intact, Spontaneous.  Cord: 3 vessels with the following complications: None.    Anesthesia: Epidural  Episiotomy: None Lacerations: Periurethral;1st degree Suture Repair: 4-0 Monocryl Est. Blood Loss (mL): 308  Mom to postpartum.  Baby to Couplet care / Skin to Skin.  [facility] Rural HospitalWILLIAMS,MARIE 08/11/2014, 7:51 AM

## 2014-04-18 ENCOUNTER — Inpatient Hospital Stay (HOSPITAL_COMMUNITY)
Admission: AD | Admit: 2014-04-18 | Discharge: 2014-04-18 | Disposition: A | Discharge status: Home / Self Care | Payer: Medicaid Other | Source: Ambulatory Visit | Attending: Obstetrics and Gynecology | Admitting: Obstetrics and Gynecology

## 2014-04-18 ENCOUNTER — Inpatient Hospital Stay (HOSPITAL_COMMUNITY): Payer: Medicaid Other

## 2014-04-18 DIAGNOSIS — O9989 Other specified diseases and conditions complicating pregnancy, childbirth and the puerperium: Secondary | ICD-10-CM | POA: Insufficient documentation

## 2014-04-18 DIAGNOSIS — S3981XA Other specified injuries of abdomen, initial encounter: Secondary | ICD-10-CM

## 2014-04-18 DIAGNOSIS — S3991XA Unspecified injury of abdomen, initial encounter: Secondary | ICD-10-CM

## 2014-04-18 DIAGNOSIS — Y9329 Activity, other involving ice and snow: Secondary | ICD-10-CM | POA: Insufficient documentation

## 2014-04-18 DIAGNOSIS — Z3A24 24 weeks gestation of pregnancy: Secondary | ICD-10-CM | POA: Insufficient documentation

## 2014-04-18 DIAGNOSIS — W541XXA Struck by dog, initial encounter: Secondary | ICD-10-CM | POA: Insufficient documentation

## 2014-04-18 DIAGNOSIS — O9A212 Injury, poisoning and certain other consequences of external causes complicating pregnancy, second trimester: Secondary | ICD-10-CM

## 2014-04-18 LAB — URINALYSIS, ROUTINE W REFLEX MICROSCOPIC
BILIRUBIN URINE: NEGATIVE
GLUCOSE, UA: NEGATIVE mg/dL
Hgb urine dipstick: NEGATIVE
Ketones, ur: NEGATIVE mg/dL
Leukocytes, UA: NEGATIVE
Nitrite: NEGATIVE
Protein, ur: NEGATIVE mg/dL
Specific Gravity, Urine: 1.01 (ref 1.005–1.030)
Urobilinogen, UA: 0.2 mg/dL (ref 0.0–1.0)
pH: 6 (ref 5.0–8.0)

## 2014-04-18 NOTE — MAU Note (Signed)
Pt states here for eval per MD. Was sitting in snow when dog jumped on pt, landing on her upper stomach. Denies bleeding or abnormal vaginal discharge.

## 2014-04-18 NOTE — Discharge Instructions (Signed)
What Do I Need to Know About Injuries During Pregnancy? °Injuries can happen during pregnancy. Minor falls and accidents usually do not harm you or your baby. However, any injury should be reported to your doctor. °WHAT CAN I DO TO PROTECT MYSELF FROM INJURIES? °· Remove rugs and loose objects on the floor. °· Wear comfortable shoes that have a good grip. Do not wear high-heeled shoes. °· Always wear your seat belt. The lap belt should be below your belly. Always practice safe driving. °· Do not ride on a motorcycle. °· Do not participate in high-impact activities or sports. °· Avoid: °¨ Walking on wet or slippery floors. °¨ Fires. °¨ Starting fires. °¨ Lifting heavy pots of boiling or hot liquids. °¨ Fixing electrical problems. °· Only take medicine as told by your doctor. °· Know your blood type and the blood type of the baby's father. °· Call your local emergency services (911 in the [place] if you are a victim of domestic violence or assault. For help and support, contact the National Domestic Violence Hotline. °WHEN SHOULD I GET HELP RIGHT AWAY? °· You fall on your belly or have any high-impact accident or injury. °· You have been a victim of domestic violence or any kind of violence. °· You have been in a car accident. °· You have bleeding from your vagina. °· Fluid is leaking from your vagina. °· You start to have belly cramping (contractions) or pain. °· You feel weak or pass out (faint). °· You start to throw up (vomit) after an injury. °· You have been burned. °· You have a stiff neck or neck pain. °· You get a headache or have vision problems after an injury. °· You do not feel the baby move or the baby is not moving as much as normal. °Document Released: 04/15/2010 Document Revised: 07/28/2013 Document Reviewed: 12/18/2012 °ExitCare® Patient Information ©2015 [employer]. This information is not intended to replace advice given to you by your health care provider. Make sure you discuss any questions you  have with your health care provider. ° °

## 2014-04-18 NOTE — MAU Provider Note (Signed)
History     CSN: [id number]  Arrival date and time: 04/18/14 1749   None     Chief Complaint  Patient presents with  . Dog jumped on stomach    HPI   Patient is 29 y.o. G1P0 [redacted]w[redacted]d presents for fall. Was playing the snow and dog pounced on belly, then fell back. Did not fall on belly.  No abdominal pain currently.  Denies LOF, bleeding, contractions, or decreased fetal movement    Past Medical History  Diagnosis Date  . Migraine without aura     Onset in early teens.  Anywhere from 2 per week to 1 per month.    Marland Kitchen GAD (generalized anxiety disorder) 11/21/2011  . Asthma, mild persistent   . Eustachian tube dysfunction     Otalgia (ED visit 01/2013)    No past surgical history on file.  Family History  Problem Relation Age of Onset  . Arthritis Mother   . Cancer Mother 26    breast  . Hypertension Father   . Arthritis Maternal Grandmother     liver cancer  . Arthritis Maternal Grandfather     stomach cancer    History  Substance Use Topics  . Smoking status: Never Smoker   . Smokeless tobacco: Never Used  . Alcohol Use: No    Allergies: No Known Allergies  Prescriptions prior to admission  Medication Sig Dispense Refill Last Dose  . acetaminophen (TYLENOL) 325 MG tablet Take 650 mg by mouth every 6 (six) hours as needed.   Taking  . albuterol (VENTOLIN HFA) 108 (90 BASE) MCG/ACT inhaler Inhale 2 puffs into the lungs every 4 (four) hours as needed for wheezing. 1 Inhaler 2 Taking  . azithromycin (ZITHROMAX Z-PAK) 250 MG tablet Use as directed (Patient not taking: Reported on 03/24/2014) 6 each 0 Not Taking  . budesonide (PULMICORT FLEXHALER) 180 MCG/ACT inhaler Inhale 1 puff into the lungs 2 (two) times daily. 1 Inhaler 6 Taking  . butalbital-acetaminophen-caffeine (FIORICET) 50-325-40 MG per tablet Take 1 tablet by mouth every 4 (four) hours as needed for headache or migraine. 20 tablet 0 Taking  . cetirizine (ZYRTEC) 10 MG tablet Take 10 mg by mouth daily.    Not Taking  . Doxylamine-Pyridoxine (DICLEGIS) 10-10 MG TBEC 2 tabs q hs, if sx persist add 1 tab q am on day 3, if sx persist add 1 tab q afternoon on day 4 100 tablet 4 Taking  . guaiFENesin (MUCINEX) 600 MG 12 hr tablet Take 600 mg by mouth 2 (two) times daily as needed.   Taking  . loratadine (CLARITIN) 10 MG tablet Take 10 mg by mouth daily.   Taking  . omeprazole (PRILOSEC) 20 MG capsule Take 1 capsule (20 mg total) by mouth daily. 30 capsule 3 Taking  . Prenatal Vit-Fe Fumarate-FA (MULTIVITAMIN-PRENATAL) 27-0.8 MG TABS tablet Take 1 tablet by mouth daily at 12 noon.   Taking  . promethazine (PHENERGAN) 12.5 MG suppository Place 1 suppository (12.5 mg total) rectally every 6 (six) hours as needed for nausea or vomiting. (Patient not taking: Reported on 03/16/2014) 12 each 0 Not Taking    Review of Systems  Constitutional: Negative for fever and chills.  Respiratory: Negative for cough and shortness of breath.   Cardiovascular: Negative for chest pain and leg swelling.  Gastrointestinal: Negative for heartburn, nausea, vomiting and diarrhea.  Genitourinary: Negative for dysuria, urgency, frequency and hematuria.  Neurological:       No headache   Physical Exam  Blood pressure 116/82, pulse 89, temperature 97.9 F (36.6 C), temperature source Oral, resp. rate 16, height 5\' 11"  (1.803 m), weight 68.72 kg (151 lb 8 oz), last menstrual period 11/01/2013.  Physical Exam  Constitutional: She is oriented to person, place, and time. She appears well-developed and well-nourished.  HENT:  Head: Normocephalic and atraumatic.  Eyes: Conjunctivae and EOM are normal.  Neck: Normal range of motion.  Cardiovascular: Normal rate.   Respiratory: Effort normal. No respiratory distress.  GI: Soft. She exhibits no distension. There is no tenderness.  Musculoskeletal: Normal range of motion. She exhibits no edema.  Neurological: She is alert and oriented to person, place, and time.  Skin: Skin is  warm and dry. No erythema.    MAU Course  Procedures  MDM 4h monitoring no contractions, reactive for gestational age  Assessment and Plan  Patient is 29 y.o. G1P0 3442w0d reporting abdominal trauma and normal 4h monitoring, no contractions - fetal kick counts reinforced - abruption precautions discussed - ob limited normal, ordered 2/2 possible uterine irritability, no contractions noted at all  Rolm BookbinderMoss, Amber 04/18/2014, 8:02 PM

## 2014-04-19 DIAGNOSIS — S3991XA Unspecified injury of abdomen, initial encounter: Secondary | ICD-10-CM | POA: Insufficient documentation

## 2014-04-19 DIAGNOSIS — Z3A24 24 weeks gestation of pregnancy: Secondary | ICD-10-CM | POA: Insufficient documentation

## 2014-04-21 ENCOUNTER — Ambulatory Visit (INDEPENDENT_AMBULATORY_CARE_PROVIDER_SITE_OTHER): Payer: Medicaid Other | Admitting: Advanced Practice Midwife

## 2014-04-21 VITALS — BP 132/72 | Wt 148.0 lb

## 2014-04-21 DIAGNOSIS — Z3402 Encounter for supervision of normal first pregnancy, second trimester: Secondary | ICD-10-CM

## 2014-04-21 DIAGNOSIS — Z331 Pregnant state, incidental: Secondary | ICD-10-CM

## 2014-04-21 DIAGNOSIS — Z1389 Encounter for screening for other disorder: Secondary | ICD-10-CM

## 2014-04-21 LAB — POCT URINALYSIS DIPSTICK
Blood, UA: NEGATIVE
Glucose, UA: NEGATIVE
Ketones, UA: NEGATIVE
LEUKOCYTES UA: NEGATIVE
Nitrite, UA: NEGATIVE
Protein, UA: NEGATIVE

## 2014-04-21 NOTE — Patient Instructions (Signed)

## 2014-04-21 NOTE — Progress Notes (Signed)
G1P0 4717w3d Estimated Date of Delivery: 08/08/14  Blood pressure 132/72, weight 148 lb (67.132 kg), last menstrual period 11/01/2013.   BP weight and urine results all reviewed and noted.  Please refer to the obstetrical flow sheet for the fundal height and fetal heart rate documentation: went to MAU after a dog jumped on her--no sequelae   Patient reports good fetal movement, denies any bleeding and no rupture of membranes symptoms or regular contractions. Patient is without complaints. All questions were answered.  Plan:  Continued routine obstetrical care,   Follow up in 4 weeks for OB appointment, PN2

## 2014-05-20 ENCOUNTER — Other Ambulatory Visit: Payer: Medicaid Other

## 2014-05-20 ENCOUNTER — Ambulatory Visit (INDEPENDENT_AMBULATORY_CARE_PROVIDER_SITE_OTHER): Payer: Medicaid Other | Admitting: Advanced Practice Midwife

## 2014-05-20 VITALS — BP 122/60 | HR 80 | Wt 152.5 lb

## 2014-05-20 DIAGNOSIS — Z3403 Encounter for supervision of normal first pregnancy, third trimester: Secondary | ICD-10-CM

## 2014-05-20 DIAGNOSIS — Z331 Pregnant state, incidental: Secondary | ICD-10-CM

## 2014-05-20 DIAGNOSIS — Z114 Encounter for screening for human immunodeficiency virus [HIV]: Secondary | ICD-10-CM

## 2014-05-20 DIAGNOSIS — Z1389 Encounter for screening for other disorder: Secondary | ICD-10-CM

## 2014-05-20 DIAGNOSIS — E049 Nontoxic goiter, unspecified: Secondary | ICD-10-CM

## 2014-05-20 DIAGNOSIS — Z131 Encounter for screening for diabetes mellitus: Secondary | ICD-10-CM

## 2014-05-20 DIAGNOSIS — Z113 Encounter for screening for infections with a predominantly sexual mode of transmission: Secondary | ICD-10-CM

## 2014-05-20 DIAGNOSIS — Z0184 Encounter for antibody response examination: Secondary | ICD-10-CM

## 2014-05-20 LAB — POCT URINALYSIS DIPSTICK
Glucose, UA: NEGATIVE
Ketones, UA: NEGATIVE
Nitrite, UA: NEGATIVE
Protein, UA: NEGATIVE
RBC UA: NEGATIVE

## 2014-05-20 NOTE — Progress Notes (Signed)
G1P0 215w4d Estimated Date of Delivery: 08/08/14  Blood pressure 122/60, pulse 80, weight 152 lb 8 oz (69.174 kg), last menstrual period 11/01/2013.   BP weight and urine results all reviewed and noted.  Please refer to the obstetrical flow sheet for the fundal height and fetal heart rate documentation:  Patient reports good fetal movement, denies any bleeding and no rupture of membranes symptoms or regular contractions. Patient feels as if her neck is bigger.  Thyroid feels sl enlarged.  C/O varicose veins, mainly in ankles.  Also has dependent edema after getting off of work All questions were answered.  Plan:  Continued routine obstetrical care, PN2 today.  Check TSH.  Support hose  Follow up in 3 weeks for OB appointment,

## 2014-05-21 LAB — CBC
HEMATOCRIT: 35 % (ref 34.0–46.6)
HEMOGLOBIN: 11.9 g/dL (ref 11.1–15.9)
MCH: 29.5 pg (ref 26.6–33.0)
MCHC: 34 g/dL (ref 31.5–35.7)
MCV: 87 fL (ref 79–97)
Platelets: 201 10*3/uL (ref 150–379)
RBC: 4.04 x10E6/uL (ref 3.77–5.28)
RDW: 12.8 % (ref 12.3–15.4)
WBC: 9.6 10*3/uL (ref 3.4–10.8)

## 2014-05-21 LAB — TSH: TSH: 1.98 u[IU]/mL (ref 0.450–4.500)

## 2014-05-21 LAB — RPR: RPR Ser Ql: NONREACTIVE

## 2014-05-21 LAB — HIV ANTIBODY (ROUTINE TESTING W REFLEX): HIV SCREEN 4TH GENERATION: NONREACTIVE

## 2014-05-21 LAB — ANTIBODY SCREEN: Antibody Screen: NEGATIVE

## 2014-05-21 LAB — GLUCOSE TOLERANCE, 2 HOURS W/ 1HR
GLUCOSE, 1 HOUR: 79 mg/dL (ref 65–179)
GLUCOSE, 2 HOUR: 68 mg/dL (ref 65–152)
Glucose, Fasting: 77 mg/dL (ref 65–91)

## 2014-05-21 LAB — HSV 2 ANTIBODY, IGG: HSV 2 Glycoprotein G Ab, IgG: <0.91 index (ref 0.00–0.90)

## 2014-05-22 ENCOUNTER — Telehealth: Payer: Self-pay | Admitting: Advanced Practice Midwife

## 2014-05-22 NOTE — Telephone Encounter (Signed)
Pt informed of WNL labs from 05/20/2014.

## 2014-06-10 ENCOUNTER — Encounter: Payer: Medicaid Other | Admitting: Women's Health

## 2014-06-11 ENCOUNTER — Ambulatory Visit (INDEPENDENT_AMBULATORY_CARE_PROVIDER_SITE_OTHER): Payer: Medicaid Other | Admitting: Advanced Practice Midwife

## 2014-06-11 VITALS — BP 122/70 | HR 96 | Wt 152.0 lb

## 2014-06-11 DIAGNOSIS — Z3403 Encounter for supervision of normal first pregnancy, third trimester: Secondary | ICD-10-CM

## 2014-06-11 DIAGNOSIS — Z331 Pregnant state, incidental: Secondary | ICD-10-CM

## 2014-06-11 DIAGNOSIS — N898 Other specified noninflammatory disorders of vagina: Secondary | ICD-10-CM | POA: Diagnosis not present

## 2014-06-11 DIAGNOSIS — Z1389 Encounter for screening for other disorder: Secondary | ICD-10-CM

## 2014-06-11 LAB — POCT URINALYSIS DIPSTICK
Blood, UA: NEGATIVE
Glucose, UA: NEGATIVE
Ketones, UA: NEGATIVE
NITRITE UA: NEGATIVE
Protein, UA: NEGATIVE

## 2014-06-11 NOTE — Progress Notes (Signed)
G1P0 7862w5d Estimated Date of Delivery: 08/08/14  Blood pressure 122/70, pulse 96, weight 152 lb (68.947 kg), last menstrual period 11/01/2013.   BP weight and urine results all reviewed and noted.  Please refer to the obstetrical flow sheet for the fundal height and fetal heart rate documentation:  Patient reports good fetal movement, denies any bleeding and no rupture of membranes symptoms or regular contractions. Patient C/O throbbing reft ear and yellow/greenish vaginal discharge, both just from time to time. No itching irritaion or discharge   .  SSE:  Normal appearing dc/ wet prep negative. Looks like mucus. Right ear sl fluid behind membrane, otherwise unremarkable All questions were answered.  Plan:  Continued routine obstetrical care, note given to decrease work days to 3/days limit.    Follow up in 2 weeks for OB appointment,

## 2014-06-25 ENCOUNTER — Ambulatory Visit (INDEPENDENT_AMBULATORY_CARE_PROVIDER_SITE_OTHER): Payer: Medicaid Other | Admitting: Obstetrics & Gynecology

## 2014-06-25 VITALS — BP 112/78 | HR 76 | Wt 154.0 lb

## 2014-06-25 DIAGNOSIS — Z1389 Encounter for screening for other disorder: Secondary | ICD-10-CM

## 2014-06-25 DIAGNOSIS — Z130 Encounter for screening for diseases of the blood and blood-forming organs and certain disorders involving the immune mechanism: Secondary | ICD-10-CM | POA: Diagnosis not present

## 2014-06-25 DIAGNOSIS — R42 Dizziness and giddiness: Secondary | ICD-10-CM

## 2014-06-25 DIAGNOSIS — Z331 Pregnant state, incidental: Secondary | ICD-10-CM | POA: Diagnosis not present

## 2014-06-25 DIAGNOSIS — Z3403 Encounter for supervision of normal first pregnancy, third trimester: Secondary | ICD-10-CM

## 2014-06-25 LAB — POCT HEMOGLOBIN: Hemoglobin: 11.3 g/dL — AB (ref 12.2–16.2)

## 2014-06-25 LAB — POCT URINALYSIS DIPSTICK
Blood, UA: NEGATIVE
Glucose, UA: NEGATIVE
KETONES UA: NEGATIVE
Leukocytes, UA: NEGATIVE
Nitrite, UA: NEGATIVE
PROTEIN UA: NEGATIVE

## 2014-06-25 NOTE — Progress Notes (Signed)
G1P0 5161w5d Estimated Date of Delivery: 08/08/14  Blood pressure 112/78, pulse 76, weight 154 lb (69.854 kg), last menstrual period 11/01/2013.   BP weight and urine results all reviewed and noted.  Please refer to the obstetrical flow sheet for the fundal height and fetal heart rate documentation:  Patient reports good fetal movement, denies any bleeding and no rupture of membranes symptoms or regular contractions. Patient is without complaints. All questions were answered.  Plan:  Continued routine obstetrical care,   Follow up in 2 weeks for OB appointment, watch growth progression, FH starting to flatten a bit, if remains so will do sonogram to check fetal weight and status

## 2014-06-30 ENCOUNTER — Ambulatory Visit (INDEPENDENT_AMBULATORY_CARE_PROVIDER_SITE_OTHER): Payer: Medicaid Other | Admitting: Obstetrics & Gynecology

## 2014-06-30 ENCOUNTER — Encounter: Payer: Self-pay | Admitting: Obstetrics & Gynecology

## 2014-06-30 VITALS — BP 130/80 | HR 88 | Wt 156.0 lb

## 2014-06-30 DIAGNOSIS — O368131 Decreased fetal movements, third trimester, fetus 1: Secondary | ICD-10-CM | POA: Diagnosis not present

## 2014-06-30 DIAGNOSIS — Z1389 Encounter for screening for other disorder: Secondary | ICD-10-CM

## 2014-06-30 DIAGNOSIS — Z331 Pregnant state, incidental: Secondary | ICD-10-CM

## 2014-06-30 LAB — POCT URINALYSIS DIPSTICK
Blood, UA: NEGATIVE
Glucose, UA: NEGATIVE
Ketones, UA: NEGATIVE
LEUKOCYTES UA: NEGATIVE
NITRITE UA: NEGATIVE
PROTEIN UA: NEGATIVE

## 2014-06-30 NOTE — Progress Notes (Signed)
[employer] in [employer] visit  Cc  Decreased fetal movement  Reactive NST  G1P1001 3663w0d Estimated Date of Delivery: 08/08/14  Blood pressure 130/80, pulse 88, weight 156 lb (70.761 kg), last menstrual period 11/01/2013, unknown if currently breastfeeding.   BP weight and urine results all reviewed and noted.  Please refer to the obstetrical flow sheet for the fundal height and fetal heart rate documentation:  Patient reports good fetal movement, denies any bleeding and no rupture of membranes symptoms or regular contractions. Patient is without complaints. All questions were answered.  Plan:  Continued routine obstetrical care,   Follow up in keep weeks for OB appointment,

## 2014-07-09 ENCOUNTER — Ambulatory Visit (INDEPENDENT_AMBULATORY_CARE_PROVIDER_SITE_OTHER): Payer: Medicaid Other | Admitting: Obstetrics & Gynecology

## 2014-07-09 ENCOUNTER — Encounter: Payer: Self-pay | Admitting: Obstetrics & Gynecology

## 2014-07-09 ENCOUNTER — Encounter: Payer: Medicaid Other | Admitting: Obstetrics & Gynecology

## 2014-07-09 VITALS — BP 128/82 | HR 84 | Wt 156.0 lb

## 2014-07-09 DIAGNOSIS — Z331 Pregnant state, incidental: Secondary | ICD-10-CM

## 2014-07-09 DIAGNOSIS — Z3403 Encounter for supervision of normal first pregnancy, third trimester: Secondary | ICD-10-CM

## 2014-07-09 DIAGNOSIS — Z1389 Encounter for screening for other disorder: Secondary | ICD-10-CM

## 2014-07-09 LAB — POCT URINALYSIS DIPSTICK
Blood, UA: NEGATIVE
GLUCOSE UA: NEGATIVE
Ketones, UA: NEGATIVE
Nitrite, UA: NEGATIVE
Protein, UA: NEGATIVE

## 2014-07-09 NOTE — Progress Notes (Signed)
G1P0 166w5d Estimated Date of Delivery: 08/08/14  Blood pressure 128/82, pulse 84, weight 156 lb (70.761 kg), last menstrual period 11/01/2013.   BP weight and urine results all reviewed and noted.  Please refer to the obstetrical flow sheet for the fundal height and fetal heart rate documentation:  Patient reports good fetal movement, denies any bleeding and no rupture of membranes symptoms or regular contractions. Patient is without complaints. All questions were answered.  Plan:  Continued routine obstetrical care,   Follow up in 1 weeks for OB appointment,

## 2014-07-15 ENCOUNTER — Encounter: Payer: Self-pay | Admitting: Obstetrics and Gynecology

## 2014-07-15 ENCOUNTER — Ambulatory Visit (INDEPENDENT_AMBULATORY_CARE_PROVIDER_SITE_OTHER): Payer: Medicaid Other | Admitting: Obstetrics and Gynecology

## 2014-07-15 VITALS — BP 100/60 | HR 84 | Wt 159.0 lb

## 2014-07-15 DIAGNOSIS — Z3493 Encounter for supervision of normal pregnancy, unspecified, third trimester: Secondary | ICD-10-CM

## 2014-07-15 DIAGNOSIS — Z331 Pregnant state, incidental: Secondary | ICD-10-CM

## 2014-07-15 DIAGNOSIS — Z1389 Encounter for screening for other disorder: Secondary | ICD-10-CM

## 2014-07-15 DIAGNOSIS — Z369 Encounter for antenatal screening, unspecified: Secondary | ICD-10-CM

## 2014-07-15 LAB — POCT URINALYSIS DIPSTICK
Blood, UA: NEGATIVE
GLUCOSE UA: NEGATIVE
KETONES UA: NEGATIVE
Leukocytes, UA: NEGATIVE
Nitrite, UA: NEGATIVE
Protein, UA: NEGATIVE

## 2014-07-15 NOTE — Progress Notes (Signed)
Pt states that she has been having pain under her right breast that comes and goes.

## 2014-07-15 NOTE — Progress Notes (Signed)
Patient ID: Janet Hall, female   DOB: 1985/07/17, 29 y.o.   MRN: [id number]  G1P0 4136w4d Estimated Date of Delivery: 08/08/14  Blood pressure 100/60, pulse 84, weight 159 lb (72.122 kg), last menstrual period 11/01/2013.   refer to the ob flow sheet for FH and FHR, also BP, Wt, Urine results:notable for negative  Patient reports  + good fetal movement, denies any bleeding and no rupture of membranes symptoms or regular contractions. Patient complaints: Patient notes some pain on the edge of her rib underneath her right breast. She has no other complaints at this time.  FHR: 150-156 bpm  Questions were answered. Assessment:  Plan:  Continued routine obstetrical care,      Coverage reviewed.     Referred [facility]  Call [phone] for a [facility].   This chart was SCRIBED for Christin BachJohn Joangel Vanosdol, MD by Ronney LionSuzanne Le, ED Scribe. This patient was seen in room 1 and the patient's care was started at 4:48 PM.   I personally performed the services described in this documentation, which was SCRIBED in my presence. The recorded information has been reviewed and considered accurate. It has been edited as necessary during review. Tilda BurrowFERGUSON,Janet Ganger V, MD

## 2014-07-16 LAB — OB RESULTS CONSOLE GBS: GBS: NEGATIVE

## 2014-07-16 LAB — OB RESULTS CONSOLE GC/CHLAMYDIA
Chlamydia: NEGATIVE
GC PROBE AMP, GENITAL: NEGATIVE

## 2014-07-17 ENCOUNTER — Encounter (HOSPITAL_COMMUNITY): Payer: Self-pay

## 2014-07-17 ENCOUNTER — Inpatient Hospital Stay (HOSPITAL_COMMUNITY)
Admission: AD | Admit: 2014-07-17 | Discharge: 2014-07-17 | Disposition: A | Discharge status: Home / Self Care | Payer: Medicaid Other | Source: Ambulatory Visit | Attending: Family Medicine | Admitting: Family Medicine

## 2014-07-17 DIAGNOSIS — O99613 Diseases of the digestive system complicating pregnancy, third trimester: Secondary | ICD-10-CM | POA: Diagnosis not present

## 2014-07-17 DIAGNOSIS — Z3A36 36 weeks gestation of pregnancy: Secondary | ICD-10-CM | POA: Insufficient documentation

## 2014-07-17 DIAGNOSIS — R1011 Right upper quadrant pain: Secondary | ICD-10-CM | POA: Insufficient documentation

## 2014-07-17 DIAGNOSIS — Z8719 Personal history of other diseases of the digestive system: Secondary | ICD-10-CM

## 2014-07-17 DIAGNOSIS — K219 Gastro-esophageal reflux disease without esophagitis: Secondary | ICD-10-CM | POA: Diagnosis not present

## 2014-07-17 LAB — URINALYSIS, ROUTINE W REFLEX MICROSCOPIC
Bilirubin Urine: NEGATIVE
Glucose, UA: NEGATIVE mg/dL
HGB URINE DIPSTICK: NEGATIVE
Ketones, ur: NEGATIVE mg/dL
Nitrite: NEGATIVE
Protein, ur: NEGATIVE mg/dL
SPECIFIC GRAVITY, URINE: 1.02 (ref 1.005–1.030)
Urobilinogen, UA: 0.2 mg/dL (ref 0.0–1.0)
pH: 5 (ref 5.0–8.0)

## 2014-07-17 LAB — URINE MICROSCOPIC-ADD ON

## 2014-07-17 NOTE — Progress Notes (Signed)
Dr. Loreta AveAcosta notified of pt in MAU for eval of RUQ pain. Will ask MAU midlevel to see pt if possible.

## 2014-07-17 NOTE — MAU Note (Signed)
Pt states rib pain x4 days. Denies bleding or vag d/c changes.

## 2014-07-17 NOTE — MAU Provider Note (Signed)
Chief Complaint: Abdominal Pain   First Provider Initiated Contact with Patient 07/17/14 1919     SUBJECTIVE HPI: Janet Hall is a 29 y.o. G1P0 at 2860w6d who presents with several days hx of RUQ abdominal discomfort. She describes an intermittent numbness and tingling sensation at right costal margin. It is very superficial, skin level. She says it is not "pain" but she is worried it could be due to her gallbladder or liver. Denies nausea, vomiting, malaise, fever, jaundice, severe abd pain or hx of gallstones. She has hx of  GERD, on meds since pre-pregnancy - symptoms are status quo. Denies Painful contractions, decreased fetal movement, vaginal bleeding or leakage of fluid. Pregnancy course essentially uncomplicated.   Past Medical History  Diagnosis Date  . Migraine without aura     Onset in early teens.  Anywhere from 2 per week to 1 per month.    Marland Kitchen. GAD (generalized anxiety disorder) 11/21/2011  . Asthma, mild persistent   . Eustachian tube dysfunction     Otalgia (ED visit 01/2013)   OB History  Gravida Para Term Preterm AB SAB TAB Ectopic Multiple Living  1             # Outcome Date GA Lbr Len/2nd Weight Sex Delivery Anes PTL Lv  1 Current              Past Surgical History  Procedure Laterality Date  . No past surgeries     History   Social History  . Marital Status: Married    Spouse Name: N/A  . Number of Children: N/A  . Years of Education: N/A   Occupational History  . Not on file.   Social History Main Topics  . Smoking status: Never Smoker   . Smokeless tobacco: Never Used  . Alcohol Use: No  . Drug Use: No  . Sexual Activity: Yes    Birth Control/ Protection: None   Other Topics Concern  . Not on file   Social History Narrative   Single, has boyfriend, no children.   [place] [facility] [employer] [place]   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   No T/A/Ds.   Intermittent exercising.   No current facility-administered medications on file prior to  encounter.   Current Outpatient Prescriptions on File Prior to Encounter  Medication Sig Dispense Refill  . acetaminophen (TYLENOL) 325 MG tablet Take 650 mg by mouth every 6 (six) hours as needed.    Marland Kitchen. albuterol (VENTOLIN HFA) 108 (90 BASE) MCG/ACT inhaler Inhale 2 puffs into the lungs every 4 (four) hours as needed for wheezing. 1 Inhaler 2  . budesonide (PULMICORT FLEXHALER) 180 MCG/ACT inhaler Inhale 1 puff into the lungs 2 (two) times daily. 1 Inhaler 6  . butalbital-acetaminophen-caffeine (FIORICET) 50-325-40 MG per tablet Take 1 tablet by mouth every 4 (four) hours as needed for headache or migraine. 20 tablet 0  . Doxylamine-Pyridoxine (DICLEGIS) 10-10 MG TBEC 2 tabs q hs, if sx persist add 1 tab q am on day 3, if sx persist add 1 tab q afternoon on day 4 (Patient not taking: Reported on 06/30/2014) 100 tablet 4  . loratadine (CLARITIN) 10 MG tablet Take 10 mg by mouth daily.    Marland Kitchen. omeprazole (PRILOSEC) 20 MG capsule Take 1 capsule (20 mg total) by mouth daily. 30 capsule 3  . Prenatal Vit-Fe Fumarate-FA (MULTIVITAMIN-PRENATAL) 27-0.8 MG TABS tablet Take 1 tablet by mouth daily at 12 noon.     No Known Allergies  Review  of Systems  Constitutional: Negative for fever, chills, weight loss and malaise/fatigue.  Respiratory: Negative for cough.   Gastrointestinal: Positive for heartburn. Negative for nausea, vomiting and abdominal pain.  Skin: Negative for itching and rash.  Neurological: Negative for headaches.    OBJECTIVE Blood pressure 119/77, pulse 77, temperature 98.2 F (36.8 C), resp. rate 18, height  (1.753 m), weight 158 lb (71.668 kg), last menstrual period 11/01/2013. GENERAL: Well-developed, well-nourished female in no acute distress.  HEART: normal rate RESP: normal effort GI: Abdomen soft, non-tender. S=D No organomegalyPositive bowel sounds 4. MS: Nontender, no edema NEURO: Alert and oriented SVE: declines  EFM Baseline 130s; moderate variability;  accelerations present toco: Irregular mild contractions  LAB RESULTS Results for orders placed or performed during the hospital encounter of 07/17/14 (from the past 24 hour(s))  Urinalysis, Routine w reflex microscopic     Status: Abnormal   Collection Time: 07/17/14  6:00 PM  Result Value Ref Range   Color, Urine YELLOW YELLOW   APPearance CLEAR CLEAR   Specific Gravity, Urine 1.020 1.005 - 1.030   pH 5.0 5.0 - 8.0   Glucose, UA NEGATIVE NEGATIVE mg/dL   Hgb urine dipstick NEGATIVE NEGATIVE   Bilirubin Urine NEGATIVE NEGATIVE   Ketones, ur NEGATIVE NEGATIVE mg/dL   Protein, ur NEGATIVE NEGATIVE mg/dL   Urobilinogen, UA 0.2 0.0 - 1.0 mg/dL   Nitrite NEGATIVE NEGATIVE   Leukocytes, UA SMALL (A) NEGATIVE  Urine microscopic-add on     Status: Abnormal   Collection Time: 07/17/14  6:00 PM  Result Value Ref Range   Squamous Epithelial / LPF MANY (A) RARE   WBC, UA 3-6 <3 WBC/hpf   RBC / HPF 0-2 <3 RBC/hpf   Bacteria, UA FEW (A) RARE   Urine-Other MUCOUS PRESENT     IMAGING No results found.  MAU COURSE Discussed signs and symptoms of cholecystitis. Explained her description is consistent with nerve irritation due to skin stretching and will probably subside after delivery.  ASSESSMENT 1. H/O gastroesophageal reflux (GERD)   G1 at [redacted]w[redacted]d  PLAN Discharge home with reassurance Labor and fetal movement precautions    Medication List    ASK your doctor about these medications        acetaminophen 325 MG tablet  Commonly known as:  TYLENOL  Take 650 mg by mouth every 6 (six) hours as needed.     albuterol 108 (90 BASE) MCG/ACT inhaler  Commonly known as:  VENTOLIN HFA  Inhale 2 puffs into the lungs every 4 (four) hours as needed for wheezing.     budesonide 180 MCG/ACT inhaler  Commonly known as:  PULMICORT FLEXHALER  Inhale 1 puff into the lungs 2 (two) times daily.     butalbital-acetaminophen-caffeine 50-325-40 MG per tablet  Commonly known as:  FIORICET  Take 1  tablet by mouth every 4 (four) hours as needed for headache or migraine.     Doxylamine-Pyridoxine 10-10 MG Tbec  Commonly known as:  DICLEGIS  28 tabs q hs, if sx persist add 1 tab q am on day 3, if sx persist add 1 tab q afternoon on day 4     loratadine 10 MG tablet  Commonly known as:  CLARITIN  Take 10 mg by mouth daily.     multivitamin-prenatal 27-0.8 MG Tabs tablet  Take 1 tablet by mouth daily at 12 noon.     omeprazole 20 MG capsule  Commonly known as:  PRILOSEC  Take 1 capsule (20 mg total)  by mouth daily.         Danae Orleans, CNM 07/17/2014  7:21 PM

## 2014-07-17 NOTE — MAU Note (Signed)
Pt presents to MAU with complaints of pain in the upper portion of her abdomen. Denies any vaginal bleeding or LOF

## 2014-07-17 NOTE — Discharge Instructions (Signed)
Cholecystitis Cholecystitis is an inflammation of your gallbladder. It is usually caused by a buildup of gallstones or sludge (cholelithiasis) in your gallbladder. The gallbladder stores a fluid that helps digest fats (bile). Cholecystitis is serious and needs treatment right away.  CAUSES   Gallstones. Gallstones can block the tube that leads to your gallbladder, causing bile to build up. As bile builds up, the gallbladder becomes inflamed.  Bile duct problems, such as blockage from scarring or kinking.  Tumors. Tumors can stop bile from leaving your gallbladder correctly, causing bile to build up. As bile builds up, the gallbladder becomes inflamed. SYMPTOMS   Nausea.  Vomiting.  Abdominal pain, especially in the upper right area of your abdomen.  Abdominal tenderness or bloating.  Sweating.  Chills.  Fever.  Yellowing of the skin and the whites of the eyes (jaundice). DIAGNOSIS  Your caregiver may order blood tests to look for infection or gallbladder problems. Your caregiver may also order imaging tests, such as an ultrasound or computed tomography (CT) scan. Further tests may include a hepatobiliary iminodiacetic acid (HIDA) scan. This scan allows your caregiver to see your bile move from the liver to the gallbladder and to the small intestine. TREATMENT  A hospital stay is usually necessary to lessen the inflammation of your gallbladder. You may be required to not eat or drink (fast) for a certain amount of time. You may be given medicine to treat pain or an antibiotic medicine to treat an infection. Surgery may be needed to remove your gallbladder (cholecystectomy) once the inflammation has gone down. Surgery may be needed right away if you develop complications such as death of gallbladder tissue (gangrene) or a tear (perforation) of the gallbladder.  HOME CARE INSTRUCTIONS  Home care will depend on your treatment. In general:  If you were given antibiotics, take them as  directed. Finish them even if you start to feel better.  Only take over-the-counter or prescription medicines for pain, discomfort, or fever as directed by your caregiver.  Follow a low-fat diet until you see your caregiver again.  Keep all follow-up visits as directed by your caregiver. SEEK IMMEDIATE MEDICAL CARE IF:   Your pain is increasing and not controlled by medicines.  Your pain moves to another part of your abdomen or to your back.  You have a fever.  You have nausea and vomiting. MAKE SURE YOU:  Understand these instructions.  Will watch your condition.  Will get help right away if you are not doing well or get worse. Document Released: 03/13/2005 Document Revised: 06/05/2011 Document Reviewed: 01/27/2011 [facility] Patient Information 2015 [employer] [place]. This information is not intended to replace advice given to you by your health care provider. Make sure you discuss any questions you have with your health care provider. Gastroesophageal Reflux Disease, Adult Gastroesophageal reflux disease (GERD) happens when acid from your stomach flows up into the esophagus. When acid comes in contact with the esophagus, the acid causes soreness (inflammation) in the esophagus. Over time, GERD may create small holes (ulcers) in the lining of the esophagus. CAUSES   Increased body weight. This puts pressure on the stomach, making acid rise from the stomach into the esophagus.  Smoking. This increases acid production in the stomach.  Drinking alcohol. This causes decreased pressure in the lower esophageal sphincter (valve or ring of muscle between the esophagus and stomach), allowing acid from the stomach into the esophagus.  Late evening meals and a full stomach. This increases pressure and acid production  in the stomach.  A malformed lower esophageal sphincter. Sometimes, no cause is found. SYMPTOMS   Burning pain in the lower part of the mid-chest behind the breastbone and in  the mid-stomach area. This may occur twice a week or more often.  Trouble swallowing.  Sore throat.  Dry cough.  Asthma-like symptoms including chest tightness, shortness of breath, or wheezing. DIAGNOSIS  Your caregiver may be able to diagnose GERD based on your symptoms. In some cases, X-rays and other tests may be done to check for complications or to check the condition of your stomach and esophagus. TREATMENT  Your caregiver may recommend over-the-counter or prescription medicines to help decrease acid production. Ask your caregiver before starting or adding any new medicines.  HOME CARE INSTRUCTIONS   Change the factors that you can control. Ask your caregiver for guidance concerning weight loss, quitting smoking, and alcohol consumption.  Avoid foods and drinks that make your symptoms worse, such as:  Caffeine or alcoholic drinks.  Chocolate.  Peppermint or mint flavorings.  Garlic and onions.  Spicy foods.  Citrus fruits, such as oranges, lemons, or limes.  Tomato-based foods such as sauce, chili, salsa, and pizza.  Fried and fatty foods.  Avoid lying down for the 3 hours prior to your bedtime or prior to taking a nap.  Eat small, frequent meals instead of large meals.  Wear loose-fitting clothing. Do not wear anything tight around your waist that causes pressure on your stomach.  Raise the head of your bed 6 to 8 inches with wood blocks to help you sleep. Extra pillows will not help.  Only take over-the-counter or prescription medicines for pain, discomfort, or fever as directed by your caregiver.  Do not take aspirin, ibuprofen, or other nonsteroidal anti-inflammatory drugs (NSAIDs). SEEK IMMEDIATE MEDICAL CARE IF:   You have pain in your arms, neck, jaw, teeth, or back.  Your pain increases or changes in intensity or duration.  You develop nausea, vomiting, or sweating (diaphoresis).  You develop shortness of breath, or you faint.  Your vomit is  green, yellow, black, or looks like coffee grounds or blood.  Your stool is red, bloody, or black. These symptoms could be signs of other problems, such as heart disease, gastric bleeding, or esophageal bleeding. MAKE SURE YOU:   Understand these instructions.  Will watch your condition.  Will get help right away if you are not doing well or get worse. Document Released: 12/21/2004 Document Revised: 06/05/2011 Document Reviewed: 09/30/2010 [facility] Patient Information 2015 [place], [place]. This information is not intended to replace advice given to you by your health care provider. Make sure you discuss any questions you have with your health care provider.

## 2014-07-18 LAB — GC/CHLAMYDIA PROBE AMP
CHLAMYDIA, DNA PROBE: NEGATIVE
Neisseria gonorrhoeae by PCR: NEGATIVE

## 2014-07-18 LAB — STREP GP B NAA: Strep Gp B NAA: NEGATIVE

## 2014-07-22 ENCOUNTER — Ambulatory Visit (INDEPENDENT_AMBULATORY_CARE_PROVIDER_SITE_OTHER): Payer: Medicaid Other | Admitting: Advanced Practice Midwife

## 2014-07-22 VITALS — BP 124/82 | HR 92 | Wt 159.4 lb

## 2014-07-22 DIAGNOSIS — Z1389 Encounter for screening for other disorder: Secondary | ICD-10-CM

## 2014-07-22 DIAGNOSIS — Z331 Pregnant state, incidental: Secondary | ICD-10-CM

## 2014-07-22 DIAGNOSIS — Z3403 Encounter for supervision of normal first pregnancy, third trimester: Secondary | ICD-10-CM

## 2014-07-22 LAB — POCT URINALYSIS DIPSTICK
Blood, UA: NEGATIVE
Glucose, UA: NEGATIVE
Ketones, UA: NEGATIVE
Leukocytes, UA: NEGATIVE
Nitrite, UA: NEGATIVE
Protein, UA: NEGATIVE

## 2014-07-22 NOTE — Patient Instructions (Signed)

## 2014-07-22 NOTE — Progress Notes (Signed)
G1P0 3951w4d Estimated Date of Delivery: 08/08/14  Last menstrual period 11/01/2013.   BP weight and urine results all reviewed and noted.  Please refer to the obstetrical flow sheet for the fundal height and fetal heart rate documentation:  Patient reports good fetal movement, denies any bleeding and no rupture of membranes symptoms or regular contractions. Patient is without complaints. All questions were answered.  Plan:  Continued routine obstetrical care,   Follow up in 1 weeks for OB appointment,

## 2014-07-29 ENCOUNTER — Encounter: Payer: Self-pay | Admitting: Women's Health

## 2014-07-29 ENCOUNTER — Encounter: Payer: Medicaid Other | Admitting: Women's Health

## 2014-07-29 ENCOUNTER — Ambulatory Visit (INDEPENDENT_AMBULATORY_CARE_PROVIDER_SITE_OTHER): Payer: Medicaid Other | Admitting: Women's Health

## 2014-07-29 VITALS — BP 104/66 | HR 72 | Wt 160.0 lb

## 2014-07-29 DIAGNOSIS — O26843 Uterine size-date discrepancy, third trimester: Secondary | ICD-10-CM

## 2014-07-29 DIAGNOSIS — Z3403 Encounter for supervision of normal first pregnancy, third trimester: Secondary | ICD-10-CM

## 2014-07-29 DIAGNOSIS — Z331 Pregnant state, incidental: Secondary | ICD-10-CM

## 2014-07-29 DIAGNOSIS — Z1389 Encounter for screening for other disorder: Secondary | ICD-10-CM

## 2014-07-29 LAB — POCT URINALYSIS DIPSTICK
Blood, UA: NEGATIVE
GLUCOSE UA: NEGATIVE
Ketones, UA: NEGATIVE
Leukocytes, UA: NEGATIVE
NITRITE UA: NEGATIVE
Protein, UA: NEGATIVE

## 2014-07-29 NOTE — Progress Notes (Signed)
Low-risk OB appointment G1P0 4511w4d Estimated Date of Delivery: 08/08/14 BP 104/66 mmHg  Pulse 72  Wt 160 lb (72.576 kg)  LMP 11/01/2013  BP, weight, and urine reviewed.  Refer to obstetrical flow sheet for FH & FHR.  Reports good fm.  Denies regular uc's, lof, vb, or uti s/s. Occ RUQ pain, went to hospital- told possibly mild gb irritation, or musculoskeletal. No n/v, no correlation to food. Hurts when hunches over or when pushes on it. Sounds musculoskeletal- discussed gb sx- to let us know if develops any of these.  SVE per request: 3/80/-1, vtx, anterior Reviewed labor s/s, fkc. Plan:  Continue routine obstetrical care  F/U asap for efw/afi u/s for s<d (likely from baby engaging, but pt states she would feel better w/ u/s), then 1wk for OB appointment

## 2014-07-29 NOTE — Patient Instructions (Signed)
Call the office ([phone]) or go to [facility] if:  You begin to have strong, frequent contractions  Your water breaks.  Sometimes it is a big gush of fluid, sometimes it is just a trickle that keeps getting your panties wet or running down your legs  You have vaginal bleeding.  It is normal to have a small amount of spotting if your cervix was checked.   You don't feel your baby moving like normal.  If you don't, get you something to eat and drink and lay down and focus on feeling your baby move.  You should feel at least 10 movements in 2 hours.  If you don't, you should call the office or go to [facility].    Braxton Hicks Contractions Contractions of the uterus can occur throughout pregnancy. Contractions are not always a sign that you are in labor.  WHAT ARE BRAXTON HICKS CONTRACTIONS?  Contractions that occur before labor are called Braxton Hicks contractions, or false labor. Toward the end of pregnancy (32-34 weeks), these contractions can develop more often and may become more forceful. This is not true labor because these contractions do not result in opening (dilatation) and thinning of the cervix. They are sometimes difficult to tell apart from true labor because these contractions can be forceful and people have different pain tolerances. You should not feel embarrassed if you go to the hospital with false labor. Sometimes, the only way to tell if you are in true labor is for your health care provider to look for changes in the cervix. If there are no prenatal problems or other health problems associated with the pregnancy, it is completely safe to be sent home with false labor and await the onset of true labor. HOW CAN YOU TELL THE DIFFERENCE BETWEEN TRUE AND FALSE LABOR? False Labor  The contractions of false labor are usually shorter and not as hard as those of true labor.   The contractions are usually irregular.   The contractions are often felt in the front of  the lower abdomen and in the groin.   The contractions may go away when you walk around or change positions while lying down.   The contractions get weaker and are shorter lasting as time goes on.   The contractions do not usually become progressively stronger, regular, and closer together as with true labor.  True Labor  Contractions in true labor last 30-70 seconds, become very regular, usually become more intense, and increase in frequency.   The contractions do not go away with walking.   The discomfort is usually felt in the top of the uterus and spreads to the lower abdomen and low back.   True labor can be determined by your health care provider with an exam. This will show that the cervix is dilating and getting thinner.  WHAT TO REMEMBER  Keep up with your usual exercises and follow other instructions given by your health care provider.   Take medicines as directed by your health care provider.   Keep your regular prenatal appointments.   Eat and drink lightly if you think you are going into labor.   If Braxton Hicks contractions are making you uncomfortable:   Change your position from lying down or resting to walking, or from walking to resting.   Sit and rest in a tub of warm water.   Drink 2-3 glasses of water. Dehydration may cause these contractions.   Do slow and deep breathing several times an hour.    WHEN SHOULD I SEEK IMMEDIATE MEDICAL CARE? Seek immediate medical care if:  Your contractions become stronger, more regular, and closer together.   You have fluid leaking or gushing from your vagina.   You have a fever.   You pass blood-tinged mucus.   You have vaginal bleeding.   You have continuous abdominal pain.   You have low back pain that you never had before.   You feel your baby's head pushing down and causing pelvic pressure.   Your baby is not moving as much as it used to.  Document Released: 03/13/2005 Document  Revised: 03/18/2013 Document Reviewed: 12/23/2012 [employer] Patient Information 2015 [employer]. This information is not intended to replace advice given to you by your health care provider. Make sure you discuss any questions you have with your health care provider.  

## 2014-08-03 ENCOUNTER — Ambulatory Visit (INDEPENDENT_AMBULATORY_CARE_PROVIDER_SITE_OTHER): Payer: Medicaid Other

## 2014-08-03 DIAGNOSIS — O26843 Uterine size-date discrepancy, third trimester: Secondary | ICD-10-CM

## 2014-08-03 DIAGNOSIS — Z3403 Encounter for supervision of normal first pregnancy, third trimester: Secondary | ICD-10-CM

## 2014-08-03 NOTE — Progress Notes (Signed)
US [redacted]W[redacted]D measurements c/w dates,EFW 3604(7lbs15oz) 66.8%,ant pl gr 3 , afi 10.08cm,fht 121bpm,cephalic

## 2014-08-05 ENCOUNTER — Ambulatory Visit (INDEPENDENT_AMBULATORY_CARE_PROVIDER_SITE_OTHER): Payer: Medicaid Other | Admitting: Advanced Practice Midwife

## 2014-08-05 ENCOUNTER — Encounter: Payer: Self-pay | Admitting: Advanced Practice Midwife

## 2014-08-05 VITALS — BP 104/64 | HR 68 | Wt 160.0 lb

## 2014-08-05 DIAGNOSIS — Z803 Family history of malignant neoplasm of breast: Secondary | ICD-10-CM

## 2014-08-05 DIAGNOSIS — Z1389 Encounter for screening for other disorder: Secondary | ICD-10-CM

## 2014-08-05 DIAGNOSIS — Z3403 Encounter for supervision of normal first pregnancy, third trimester: Secondary | ICD-10-CM

## 2014-08-05 DIAGNOSIS — Z3A39 39 weeks gestation of pregnancy: Secondary | ICD-10-CM

## 2014-08-05 DIAGNOSIS — Z331 Pregnant state, incidental: Secondary | ICD-10-CM

## 2014-08-05 LAB — POCT URINALYSIS DIPSTICK
Glucose, UA: NEGATIVE
Ketones, UA: NEGATIVE
Leukocytes, UA: NEGATIVE
Nitrite, UA: NEGATIVE
Protein, UA: NEGATIVE
RBC UA: NEGATIVE

## 2014-08-05 NOTE — Progress Notes (Signed)
G1P0 2051w4d Estimated Date of Delivery: 08/08/14  Blood pressure 104/64, pulse 68, weight 160 lb (72.576 kg), last menstrual period 11/01/2013.   BP weight and urine results all reviewed and noted.  Please refer to the obstetrical flow sheet for the fundal height and fetal heart rate documentation: US 2 days ago shows normal AFI (10) and EFW 66% Patient reports good fetal movement, denies any bleeding and no rupture of membranes symptoms or regular contractions. Patient is without complaints. All questions were answered.  Plan:  Continued routine obstetrical care,   Follow up in 1 weeks for OB appointment, will strip membranes then (mom in hospital today)

## 2014-08-07 ENCOUNTER — Telehealth: Payer: Self-pay | Admitting: Obstetrics & Gynecology

## 2014-08-07 NOTE — Telephone Encounter (Signed)
Pt c/o vaginal spotting just when she wipes, no contractions, +FM, no straining BM. Pt states Drenda FreezeFran did perform cervical check at her appt on 08/05/2014. Pt informed spotting probably due to cervical check, continue to monitor if increases, cramping to call our office back or go to MAU. Pt verbalized understanding.

## 2014-08-10 ENCOUNTER — Inpatient Hospital Stay (HOSPITAL_COMMUNITY)
Admission: AD | Admit: 2014-08-10 | Discharge: 2014-08-13 | DRG: 775 | Disposition: A | Discharge status: Home / Self Care | Payer: Medicaid Other | Source: Ambulatory Visit | Attending: Family Medicine | Admitting: Family Medicine

## 2014-08-10 ENCOUNTER — Encounter (HOSPITAL_COMMUNITY): Payer: Self-pay | Admitting: *Deleted

## 2014-08-10 DIAGNOSIS — O9952 Diseases of the respiratory system complicating childbirth: Principal | ICD-10-CM | POA: Diagnosis present

## 2014-08-10 DIAGNOSIS — F419 Anxiety disorder, unspecified: Secondary | ICD-10-CM | POA: Diagnosis not present

## 2014-08-10 DIAGNOSIS — J45909 Unspecified asthma, uncomplicated: Secondary | ICD-10-CM | POA: Diagnosis present

## 2014-08-10 DIAGNOSIS — O99344 Other mental disorders complicating childbirth: Secondary | ICD-10-CM | POA: Diagnosis not present

## 2014-08-10 DIAGNOSIS — IMO0001 Reserved for inherently not codable concepts without codable children: Secondary | ICD-10-CM

## 2014-08-10 DIAGNOSIS — Z3A40 40 weeks gestation of pregnancy: Secondary | ICD-10-CM | POA: Diagnosis present

## 2014-08-10 NOTE — MAU Note (Signed)
Contractions every 3-7 minutes. Denies bright red vaginal bleeding.  States Positive bloody show.  Positive fetal movement Denies SROM/LOF Denies any infections/complications of pregnancy  GBS negative per patient

## 2014-08-10 NOTE — MAU Note (Signed)
Contractions tonight. Denies LOF. Some bloody show that is brown. Positive fetal movement. Denies complications with pregnancy.

## 2014-08-11 ENCOUNTER — Inpatient Hospital Stay (HOSPITAL_COMMUNITY): Payer: Medicaid Other | Admitting: Anesthesiology

## 2014-08-11 ENCOUNTER — Encounter (HOSPITAL_COMMUNITY): Payer: Self-pay | Admitting: *Deleted

## 2014-08-11 DIAGNOSIS — J45909 Unspecified asthma, uncomplicated: Secondary | ICD-10-CM | POA: Diagnosis present

## 2014-08-11 DIAGNOSIS — Z3A40 40 weeks gestation of pregnancy: Secondary | ICD-10-CM | POA: Diagnosis present

## 2014-08-11 DIAGNOSIS — O99344 Other mental disorders complicating childbirth: Secondary | ICD-10-CM | POA: Diagnosis present

## 2014-08-11 DIAGNOSIS — O9952 Diseases of the respiratory system complicating childbirth: Secondary | ICD-10-CM | POA: Diagnosis present

## 2014-08-11 DIAGNOSIS — IMO0001 Reserved for inherently not codable concepts without codable children: Secondary | ICD-10-CM

## 2014-08-11 DIAGNOSIS — F419 Anxiety disorder, unspecified: Secondary | ICD-10-CM | POA: Diagnosis present

## 2014-08-11 LAB — TYPE AND SCREEN
ABO/RH(D): O POS
ANTIBODY SCREEN: NEGATIVE

## 2014-08-11 LAB — CBC
HCT: 35.6 % — ABNORMAL LOW (ref 36.0–46.0)
Hemoglobin: 11.7 g/dL — ABNORMAL LOW (ref 12.0–15.0)
MCH: 27 pg (ref 26.0–34.0)
MCHC: 32.9 g/dL (ref 30.0–36.0)
MCV: 82.2 fL (ref 78.0–100.0)
Platelets: 202 10*3/uL (ref 150–400)
RBC: 4.33 MIL/uL (ref 3.87–5.11)
RDW: 13.6 % (ref 11.5–15.5)
WBC: 16 10*3/uL — ABNORMAL HIGH (ref 4.0–10.5)

## 2014-08-11 LAB — ABO/RH: ABO/RH(D): O POS

## 2014-08-11 LAB — RPR: RPR Ser Ql: NONREACTIVE

## 2014-08-11 MED ORDER — PHENYLEPHRINE 40 MCG/ML (10ML) SYRINGE FOR IV PUSH (FOR BLOOD PRESSURE SUPPORT)
PREFILLED_SYRINGE | INTRAVENOUS | Status: AC
  Filled 2014-08-11: qty 20

## 2014-08-11 MED ORDER — DIBUCAINE 1 % RE OINT: 1.0000 "application " | TOPICAL_OINTMENT | RECTAL | Status: DC | PRN

## 2014-08-11 MED ORDER — LIDOCAINE HCL (PF) 1 % IJ SOLN
30.0000 mL | INTRAMUSCULAR | Status: DC | PRN
  Filled 2014-08-11: qty 30

## 2014-08-11 MED ORDER — TETANUS-DIPHTH-ACELL PERTUSSIS 5-2.5-18.5 LF-MCG/0.5 IM SUSP: 0.5000 mL | Freq: Once | INTRAMUSCULAR | Status: DC

## 2014-08-11 MED ORDER — PRENATAL MULTIVITAMIN CH
1.0000 | ORAL_TABLET | Freq: Every day | ORAL | Status: DC
  Administered 2014-08-11 – 2014-08-12 (×2): 1 via ORAL
  Filled 2014-08-11 (×2): qty 1

## 2014-08-11 MED ORDER — BUPIVACAINE HCL (PF) 0.25 % IJ SOLN
INTRAMUSCULAR | Status: DC | PRN
  Administered 2014-08-11 (×2): 4 mL

## 2014-08-11 MED ORDER — FLEET ENEMA 7-19 GM/118ML RE ENEM: 1.0000 | ENEMA | RECTAL | Status: DC | PRN

## 2014-08-11 MED ORDER — OXYCODONE-ACETAMINOPHEN 5-325 MG PO TABS: 2.0000 | ORAL_TABLET | ORAL | Status: DC | PRN

## 2014-08-11 MED ORDER — CITRIC ACID-SODIUM CITRATE 334-500 MG/5ML PO SOLN: 30.0000 mL | ORAL | Status: DC | PRN

## 2014-08-11 MED ORDER — ACETAMINOPHEN 325 MG PO TABS
650.0000 mg | ORAL_TABLET | ORAL | Status: DC | PRN
  Administered 2014-08-12 – 2014-08-13 (×2): 650 mg via ORAL
  Filled 2014-08-11 (×2): qty 2

## 2014-08-11 MED ORDER — IBUPROFEN 600 MG PO TABS
600.0000 mg | ORAL_TABLET | Freq: Four times a day (QID) | ORAL | Status: DC
  Administered 2014-08-11 – 2014-08-13 (×8): 600 mg via ORAL
  Filled 2014-08-11 (×9): qty 1

## 2014-08-11 MED ORDER — OXYTOCIN 40 UNITS IN LACTATED RINGERS INFUSION - SIMPLE MED
62.5000 mL/h | INTRAVENOUS | Status: DC
  Filled 2014-08-11: qty 1000

## 2014-08-11 MED ORDER — OXYCODONE-ACETAMINOPHEN 5-325 MG PO TABS: 1.0000 | ORAL_TABLET | ORAL | Status: DC | PRN

## 2014-08-11 MED ORDER — WITCH HAZEL-GLYCERIN EX PADS: 1.0000 "application " | MEDICATED_PAD | CUTANEOUS | Status: DC | PRN

## 2014-08-11 MED ORDER — SENNOSIDES-DOCUSATE SODIUM 8.6-50 MG PO TABS
2.0000 | ORAL_TABLET | ORAL | Status: DC
  Administered 2014-08-11: 2 via ORAL
  Filled 2014-08-11 (×2): qty 2

## 2014-08-11 MED ORDER — BENZOCAINE-MENTHOL 20-0.5 % EX AERO
1.0000 "application " | INHALATION_SPRAY | CUTANEOUS | Status: DC | PRN
  Administered 2014-08-13: 1 via TOPICAL
  Filled 2014-08-11 (×2): qty 56

## 2014-08-11 MED ORDER — SIMETHICONE 80 MG PO CHEW: 80.0000 mg | CHEWABLE_TABLET | ORAL | Status: DC | PRN

## 2014-08-11 MED ORDER — ACETAMINOPHEN 325 MG PO TABS: 650.0000 mg | ORAL_TABLET | ORAL | Status: DC | PRN

## 2014-08-11 MED ORDER — LIDOCAINE-EPINEPHRINE (PF) 2 %-1:200000 IJ SOLN
INTRAMUSCULAR | Status: DC | PRN
  Administered 2014-08-11: 3 mL

## 2014-08-11 MED ORDER — OXYTOCIN BOLUS FROM INFUSION
500.0000 mL | INTRAVENOUS | Status: DC
  Administered 2014-08-11: 500 mL via INTRAVENOUS

## 2014-08-11 MED ORDER — ONDANSETRON HCL 4 MG/2ML IJ SOLN
4.0000 mg | INTRAMUSCULAR | Status: DC | PRN
Start: 2014-08-11 — End: 2014-08-13

## 2014-08-11 MED ORDER — LANOLIN HYDROUS EX OINT: TOPICAL_OINTMENT | CUTANEOUS | Status: DC | PRN

## 2014-08-11 MED ORDER — FENTANYL 2.5 MCG/ML BUPIVACAINE 1/10 % EPIDURAL INFUSION (WH - ANES)
INTRAMUSCULAR | Status: AC
  Administered 2014-08-11: 03:00:00
  Filled 2014-08-11: qty 125

## 2014-08-11 MED ORDER — LACTATED RINGERS IV SOLN
INTRAVENOUS | Status: DC
  Administered 2014-08-11 (×2): via INTRAVENOUS

## 2014-08-11 MED ORDER — PHENOL 1.4 % MT LIQD
1.0000 | OROMUCOSAL | Status: DC | PRN
  Filled 2014-08-11: qty 177

## 2014-08-11 MED ORDER — DIPHENHYDRAMINE HCL 25 MG PO CAPS: 25.0000 mg | ORAL_CAPSULE | Freq: Four times a day (QID) | ORAL | Status: DC | PRN

## 2014-08-11 MED ORDER — ONDANSETRON HCL 4 MG PO TABS: 4.0000 mg | ORAL_TABLET | ORAL | Status: DC | PRN

## 2014-08-11 MED ORDER — ONDANSETRON HCL 4 MG/2ML IJ SOLN: 4.0000 mg | Freq: Four times a day (QID) | INTRAMUSCULAR | Status: DC | PRN

## 2014-08-11 MED ORDER — FENTANYL 2.5 MCG/ML BUPIVACAINE 1/10 % EPIDURAL INFUSION (WH - ANES)
INTRAMUSCULAR | Status: DC | PRN
  Administered 2014-08-11: 12 mL/h via EPIDURAL

## 2014-08-11 MED ORDER — ZOLPIDEM TARTRATE 5 MG PO TABS
5.0000 mg | ORAL_TABLET | Freq: Every evening | ORAL | Status: DC | PRN
Start: 2014-08-11 — End: 2014-08-13

## 2014-08-11 MED ORDER — LACTATED RINGERS IV SOLN
500.0000 mL | INTRAVENOUS | Status: DC | PRN
  Administered 2014-08-11: 500 mL via INTRAVENOUS

## 2014-08-11 NOTE — H&P (Signed)
Janet Hall is a 29 y.o. female presenting for labor.  Started having strong contractions at about 7pm.  Denies leaking or bleeding.  Maternal Medical History:  Reason for admission: Contractions.  Nausea.  Contractions: Onset was 3-5 hours ago.   Frequency: regular.   Perceived severity is strong.    Fetal activity: Perceived fetal activity is normal.   Last perceived fetal movement was within the past hour.    Prenatal complications: No bleeding, HIV, PIH, pre-eclampsia or preterm labor.   Prenatal Complications - Diabetes: none.    OB History    Gravida Para Term Preterm AB TAB SAB Ectopic Multiple Living   2              Past Medical History  Diagnosis Date  . Migraine without aura     Onset in early teens.  Anywhere from 2 per week to 1 per month.    Marland Kitchen. GAD (generalized anxiety disorder) 11/21/2011  . Asthma, mild persistent    Past Surgical History  Procedure Laterality Date  . No past surgeries     Family History: family history includes Arthritis in her maternal grandfather, maternal grandmother, and mother; Cancer (age of onset: 5857) in her mother; Diabetes in her paternal grandmother; Heart attack in her paternal grandmother; Hypertension in her father; Other in her paternal grandfather. Social History:  reports that she has never smoked. She has never used smokeless tobacco. She reports that she does not drink alcohol or use illicit drugs.   Prenatal Transfer Tool  Maternal Diabetes: No Genetic Screening: Normal Maternal Ultrasounds/Referrals: Normal Fetal Ultrasounds or other Referrals:  None Maternal Substance Abuse:  No Significant Maternal Medications:  None Significant Maternal Lab Results:  None  GBS Negative Other Comments:  None  Review of Systems  Constitutional: Negative for fever, chills and malaise/fatigue.  Gastrointestinal: Positive for abdominal pain. Negative for nausea, vomiting, diarrhea and constipation.  Genitourinary: Negative for  dysuria.  Musculoskeletal: Negative for myalgias.  Neurological: Negative for focal weakness, weakness and headaches.    Dilation: 7 Effacement (%): 100 Station: 0 Exam by:: Elie ConferK. Weiss RN Blood pressure 125/82, pulse 82, temperature 97.8 F (36.6 C), temperature source Oral, resp. rate 20, height 5\' 10"  (1.778 m), weight 164 lb (74.39 kg), last menstrual period 11/01/2013, unknown if currently breastfeeding. Maternal Exam:  Uterine Assessment: Contraction strength is firm.  Contraction frequency is regular.   Abdomen: Patient reports no abdominal tenderness. Fundal height is 38.   Estimated fetal weight is 7.   Fetal presentation: vertex  Introitus: Normal vulva. Normal vagina.  Vagina is negative for discharge.  Ferning test: not done.  Nitrazine test: not done. Amniotic fluid character: not assessed.  Pelvis: adequate for delivery.   Cervix: Cervix evaluated by digital exam.     Fetal Exam Fetal Monitor Review: Mode: ultrasound.   Baseline rate: 140.  Variability: moderate (6-25 bpm).   Pattern: accelerations present and no decelerations.    Fetal State Assessment: Category I - tracings are normal.     Physical Exam  Constitutional: She is oriented to person, place, and time. She appears well-developed and well-nourished. No distress.  HENT:  Head: Normocephalic.  Neck: Normal range of motion. Neck supple.  Cardiovascular: Normal rate, regular rhythm and normal heart sounds.  Exam reveals no gallop and no friction rub.   No murmur heard. Respiratory: Effort normal and breath sounds normal. No respiratory distress. She has no wheezes. She has no rales.  GI: Soft. She exhibits no distension.  There is no tenderness. There is no rebound and no guarding.  Genitourinary: Vagina normal. No vaginal discharge found.  Cervix was 4cm for 2 hours then changed to 7cm  Musculoskeletal: Normal range of motion. She exhibits no edema.  Neurological: She is alert and oriented to  person, place, and time. She has normal reflexes. She displays normal reflexes. She exhibits normal muscle tone.  Skin: Skin is warm and dry.  Psychiatric: She has a normal mood and affect.    Prenatal labs: ABO, Rh: --/--/O POS (05/17 0225) Antibody: NEG (05/17 0225) Rubella: 2.83 (10/20 1604) RPR: Non Reactive (02/24 0857)  HBsAg: NEGATIVE (10/20 1604)  HIV: NONREACTIVE (10/20 1604)  GBS: Negative (04/21 0000)   Assessment/Plan: A:  SIUP at 10556w3d       Active Labor      GBS Negative  P;  [facility]      Routine orders      Wants epidural      Anticipate SVD   [facility],Janet Hall 08/11/2014, 3:40 AM

## 2014-08-11 NOTE — Anesthesia Postprocedure Evaluation (Signed)
Anesthesia Post Note  Patient: Janet Hall  Procedure(s) Performed: * No procedures listed *  Anesthesia type: Epidural  Patient location: Mother/Baby  Post pain: Pain level controlled  Post assessment: Post-op Vital signs reviewed  Last Vitals:  Filed Vitals:   08/11/14 1337  BP: 125/78  Pulse: 69  Temp: 36.8 C  Resp: 18    Post vital signs: Reviewed  Level of consciousness:alert  Complications: No apparent anesthesia complications

## 2014-08-11 NOTE — Progress Notes (Signed)
Dr. Loreta AveAcosta called and was updated on patient's numb right leg and that she had her right leg give out earlier and slightly hit her knee. Patient very nervous and wants to talk to a MD about her numbness in her leg. Patient just voided and walked back to the bed without difficulty. Will monitor.

## 2014-08-11 NOTE — Progress Notes (Signed)
Patient was told to not get up by herself and she got into bed from the couch without assistance but walked fine.

## 2014-08-11 NOTE — Lactation Note (Signed)
This note was copied from the chart of Janet Alfredia Fergusonatalie Mooneyham. Lactation Consultation Note  Patient Name: Janet Alfredia Fergusonatalie Debella ZHYQM'VToday's Date: 08/11/2014 Reason for consult: Initial assessment   Initial consult at 10 hours old.  GA 40.3; BW 7 lbs, 3 oz.  Mom is a P1.  LS 5-7 by RN. Infant has breastfed x2 (10 min) + attempts x2 (0-4 min); voids-1; stools-0 since birth 10 hours ago.   Mom was breastfeeding in football hold on right side when [facility], but infant kept pulling off breast.  Mom's visitor had helped get baby latched.   LC taught sandwiching of breast with asymmetrical latching technique.  Infant latched in football hold for 5 minutes but then pulled off crying.  Hand expression taught with return demonstration and observation of colostrum easily flowing.  Mom has door-knob nipples.  Reviewed importance of deep latch with wide mouth and flanged lips.  Taught dad how to assist with teacup hold.  Switched infant position to cross-cradle on left breast. Infant breastfed in cross-cradle position on left side for 15 minutes and then infant came off fussy.  Attempted to re-latch on right side cross-cradle but infant was too uncomfortable.  Positioned mom in laid-back position and infant became more settled but had difficulty feeding with stuffy nose.  RN called and brought saline drops for nose.  Infant went to sleep STS on mom's chest in laid-back position.  Infant BF for a total of 20 minutes.  LS-8.   Educated on size of infant's stomach, feeding cues, and cluster feeding.  Encouraged to feed with cues and educated that babies should feed 8-12 times per day. Lactation brochure given and informed of hospital support group and outpatient services.  Encouraged to call for assistance as needed.   Parents verbalized to [facility] appreciation of assistance and teaching.     Maternal Data Formula Feeding for Exclusion: No Has patient been taught Hand Expression?: Yes Does the patient have breastfeeding  experience prior to this delivery?: No  Feeding Feeding Type: Breast Fed Length of feed: 20 min  LATCH Score/Interventions Latch: Grasps breast easily, tongue down, lips flanged, rhythmical sucking. Intervention(s): Teach feeding cues;Skin to skin  Audible Swallowing: A few with stimulation Intervention(s): Hand expression;Skin to skin Intervention(s): Hand expression  Type of Nipple: Everted at rest and after stimulation  Comfort (Breast/Nipple): Soft / non-tender     Hold (Positioning): Assistance needed to correctly position infant at breast and maintain latch. Intervention(s): Position options;Skin to skin;Support Pillows;Breastfeeding basics reviewed  LATCH Score: 8  Lactation Tools Discussed/Used WIC Program: Yes   Consult Status Consult Status: Follow-up Date: 08/12/14 Follow-up type: In-patient    Janet Hall, Janet Hall 08/11/2014, 5:46 PM

## 2014-08-11 NOTE — Progress Notes (Signed)
Went to assess Patient's ability to stand before assisting to the bathroom.Steady was in room available.  Patients right leg was still numb and gave out upon trying to move patient's right leg. Patient was lowered to the floor without any harm and then put back in bed then on the steady to go to the bathroom. She can stand on right leg but still numb and gives out when she tries to walk. Patient informed to not get up without RN next time she needs to go to the restroom, will monitor.

## 2014-08-11 NOTE — Anesthesia Preprocedure Evaluation (Signed)
Anesthesia Evaluation  Patient identified by MRN, date of birth, ID band Patient awake    Reviewed: Allergy & Precautions, NPO status , Patient's Chart, lab work & pertinent test results  History of Anesthesia Complications Negative for: history of anesthetic complications  Airway Mallampati: II  TM Distance: >3 FB Neck ROM: Full    Dental  (+) Teeth Intact   Pulmonary asthma ,  breath sounds clear to auscultation        Cardiovascular negative cardio ROS  Rhythm:Regular     Neuro/Psych  Headaches, negative psych ROS   GI/Hepatic negative GI ROS, Neg liver ROS,   Endo/Other  negative endocrine ROS  Renal/GU negative Renal ROS     Musculoskeletal   Abdominal   Peds  Hematology  (+) anemia ,   Anesthesia Other Findings   Reproductive/Obstetrics (+) Pregnancy                             Anesthesia Physical Anesthesia Plan  ASA: II  Anesthesia Plan: Epidural   Post-op Pain Management:    Induction:   Airway Management Planned:   Additional Equipment:   Intra-op Plan:   Post-operative Plan:   Informed Consent: I have reviewed the patients History and Physical, chart, labs and discussed the procedure including the risks, benefits and alternatives for the proposed anesthesia with the patient or authorized representative who has indicated his/her understanding and acceptance.     Plan Discussed with: Anesthesiologist  Anesthesia Plan Comments:         Anesthesia Quick Evaluation

## 2014-08-11 NOTE — Anesthesia Procedure Notes (Signed)
Epidural Patient location during procedure: OB  Staffing Anesthesiologist: Joyceann Kruser, CHRIS Performed by: anesthesiologist   Preanesthetic Checklist Completed: patient identified, surgical consent, pre-op evaluation, timeout performed, IV checked, risks and benefits discussed and monitors and equipment checked  Epidural Patient position: sitting Prep: site prepped and draped and DuraPrep Patient monitoring: heart rate, cardiac monitor, continuous pulse ox and blood pressure Approach: midline Location: L3-L4 Injection technique: LOR saline  Needle:  Needle type: Tuohy  Needle gauge: 17 G Needle length: 9 cm Needle insertion depth: 6 cm Catheter type: closed end flexible Catheter size: 19 Gauge Catheter at skin depth: 12 cm Test dose: negative and 2% lidocaine with Epi 1:200 K  Assessment Events: blood not aspirated, injection not painful, no injection resistance, negative IV test and no paresthesia  Additional Notes H+P and labs checked, risks and benefits discussed with the patient, consent obtained, procedure tolerated well and without complications.  Reason for block:procedure for pain   

## 2014-08-11 NOTE — Progress Notes (Signed)
Patient lightly bumped her right knee when lowered to the ground when her right leg gave out upon trying to assess her ability to walk...see previous progress note.

## 2014-08-11 NOTE — Progress Notes (Signed)
Patient ID: Janet Hall, female   DOB: Nov 23, 1985, 29 y.o.   MRN: [id number] Comfortable with epidural  Filed Vitals:   08/11/14 0431 08/11/14 0501 08/11/14 0531 08/11/14 0601  BP: 117/73 104/61 106/71 111/64  Pulse: 93 87 79 84  Temp:      TempSrc:      Resp:      Height:      Weight:      SpO2:       FHR stable UCs every 3-4 min  Dilation: 10 Dilation Complete Date: 08/11/14 Dilation Complete Time: 0605 Effacement (%): 100 Cervical Position: Middle Station: 0, +1 Presentation: Vertex Exam by:: [name] [name]  ve  AROM bulging bag of water (bulging out of vagina)  Will begin pushing.

## 2014-08-11 NOTE — Progress Notes (Signed)
S:  Patient stating that her right leg is still numb s/p SVD and epidural. States she has high anxiety and just worried because it still is not better. States she is able to move leg and it is improving but just worried because the other leg is normal again.   O: BP 123/78 mmHg  Pulse 86  Temp(Src) 98.3 F (36.8 C) (Oral)  Resp 18  Ht 5\' 10"  (1.778 m)  Wt 164 lb (74.39 kg)  BMI 23.53 kg/m2  SpO2 98%  LMP 11/01/2013  Breastfeeding? Unknown  General: NAD, anxious Msk: no joint swelling, no joint warmth, and no redness over joints. Range of motion nml. Tone & strength appropriate. Pulses: DP/PT are full and equal bilaterally.  Extremities: No cyanosis, clubbing, edema Neurologic: sensation decreased on R. Leg   Skin: Intact without suspicious lesions or rashes. Warm and dry.   A/P: R. Leg numbness: 2/2 epidural during labor. No red flags. Reassured patient. Since symptoms are gradually improving told pt we will monitor. Will check back in later tonight to see if improving.     Caryl AdaJazma Oreoluwa Gilmer, DO 08/11/2014, 1:24 PM PGY-1, [facility]

## 2014-08-12 ENCOUNTER — Encounter: Payer: Medicaid Other | Admitting: Women's Health

## 2014-08-12 NOTE — Progress Notes (Signed)
CLINICAL SOCIAL WORK MATERNAL/CHILD NOTE  Patient Details  Name: Janet Hall MRN: [id number] Date of Birth: 08/11/2013  Date:  08/12/2014  Clinical Social Worker Initiating Note:  Loleta BooksSarah Thursa Emme, LCSW Date/ Time Initiated:  08/12/14/0930     Child's Name:  Janet OhmsPeyton   Legal Guardian:  Parents  Need for Interpreter:  None   Date of Referral:  08/11/14     Reason for Referral:  Maternal history of Generalized Anxiety Disorder  [facility]  [facility]   Address:  [address] [place] [id number]  Phone number:  [phone]   Household Members:  Spouse   Natural Supports (not living in the home):  Immediate Family, Friends   Herbalistrofessional Supports: None   Employment:   Did not asses  Type of Work:   Did not assess  Education:    N/A  Architectinancial Resources:  Medicaid   Other Resources:    None reported  Cultural/Religious Considerations Which May Impact Care:  None reported  Strengths:  Ability to meet basic needs , Merchandiser, retailediatrician chosen , Home prepared for child    Risk Factors/Current Problems:   1)Mental Health Concerns: MOB reported family history of anxiety, stated that she has experienced symptoms for an unspecific long period of time.  MOB previously prescribed medications (prior to pregnancy) to utilize PRN.    Cognitive State:  Able to Concentrate , Alert , Goal Oriented    Mood/Affect:  Bright , Calm , Happy  . No anxiety noted in her thought process or in her behaviors.   CSW Assessment:  CSW received referral due to history of generalized anxiety.  MOB presented as receptive to the visit. She was breastfeeding the infant during the visit, and presented as calm even when the infant began to cry.  She did not present as anxious or overwhelmed, and expressed that she is attempting to have patience and take it "one day at a time" in regards to learning infant care and breastfeeding.   MOB endorsed a strong family history of anxiety, and  shared that she has experienced symptoms for a "long time".  She stated that her anxieties are mostly related to her physical health, and shared that she often believes that physical pain is the "worst thing ever". She stated that she noted a shift in symptoms when she became pregnant, and discussed a belief that despite the discomfort of pregnancy, she did not have an increase in anxiety. The FOB confirmed that the MOB did not appear or present as anxious.  MOB also denied presence of anxious thought processes that are common.  She shared belief that she now has "something to focus on" instead of the external anxieties/worries.   MOB presented with insight on her increased risk for developing a postpartum mood disorder.  She appeared engaged as CSW provided education, including common symptoms, onset of symptoms, and strategies to help MOB's mental health in upcoming weeks.  MOB agreed to contact her OBGYN or her PCP if she notes symptoms. She stated that her PCP previously prescribed her medications, but she stated that she took the medications only PRN since "it was never that bad".  MOB expressed feeling comfortable asking for help if needed.   MOB expressed appreciation for the visit and the information. She denied questions, concerns, or needs at this time, but agreed to contact CSW if needs arise.   CSW Plan/Description:   1)Patient/Family Education: Perinatal mood disorders 2)No Further Intervention Required/No Barriers to Discharge    Atom Solivan,  Monico BlitzSarah N, LCSW 08/12/2014, 11:32 AM

## 2014-08-12 NOTE — Progress Notes (Signed)
Post Partum Day 1 Subjective: no complaints, up ad lib, voiding and tolerating PO  Objective: Blood pressure 117/77, pulse 83, temperature 98 F (36.7 C), temperature source Oral, resp. rate 18, height 5\' 10"  (1.778 m), weight 164 lb (74.39 kg), last menstrual period 11/01/2013, SpO2 99 %, unknown if currently breastfeeding.  Physical Exam:  General: alert, cooperative and no distress Lochia: appropriate Uterine Fundus: firm Incision: healing well DVT Evaluation: No evidence of DVT seen on physical exam.   Recent Labs  08/11/14 0225  HGB 11.7*  HCT 35.6*    Assessment/Plan: Plan for discharge tomorrow and Breastfeeding   LOS: 1 day   [facility],Janet Cline 08/12/2014, 7:57 AM

## 2014-08-13 MED ORDER — ACETAMINOPHEN 325 MG PO TABS: 650.0000 mg | ORAL_TABLET | ORAL | Status: DC | PRN

## 2014-08-13 MED ORDER — DOCUSATE SODIUM 100 MG PO CAPS: 100.0000 mg | ORAL_CAPSULE | Freq: Two times a day (BID) | ORAL | Status: DC | PRN

## 2014-08-13 NOTE — Lactation Note (Signed)
This note was copied from the chart of [name]. Lactation ConsultatiAlfredia Fergusonon Note  Patient Name: [name] ZOXWR'UToday's Date: 08/13/2014   Visited with Mom and FOB on day of discharge, baby 2551 hrs old.  Mom states breast feeding is going well, and denies needing any assistance with latch, some soreness of nipples.  Reminded Mom to support breasts, and establish a deep latch.  Encouraged feeding skin to skin, and on cue.  Talked about engorgement prevention and treatment.  Mom using bottle supplement 10-15 ml formula, and using double electric pump for 15-20 mins.  Mom has a DEBP at home, and encouraged to continue to pump whenever baby receives supplement. Reminded Mom of OP lactation services available and encouraged her to call prn. Follow up with Pediatrician in 1 day.      Janet Hall, Janet Hall 08/13/2014, 10:30 AM

## 2014-08-13 NOTE — Discharge Summary (Signed)
Obstetric Discharge Summary Reason for Admission: onset of labor Prenatal Procedures: ultrasound Intrapartum Procedures: spontaneous vaginal delivery Postpartum Procedures: none Complications-Operative and Postpartum: 1st degree perineal laceration  Delivery Summary: At 6:59 AM a viable and healthy female was delivered via Vaginal, Spontaneous Delivery (Presentation: ; Occiput Anterior with nuchal hand). APGAR: 8, 9; weight 7 lb 3 oz (3260 g).  Placenta status: Intact, Spontaneous. Cord: 3 vessels with the following complications: None.   Anesthesia: Epidural  Episiotomy: None Lacerations: Periurethral;1st degree Suture Repair: 4-0 Monocryl  Hospital Course:  Active Problems:   Active labor at term   Janet Hall is a 29 y.o. G1P1001 s/p NSVD  Patient was admitted due to onset of labor.  She has postpartum course that was uncomplicated including no problems with ambulating, PO intake, urination, pain, or bleeding. The pt feels ready to go home and  will be discharged with outpatient follow-up.   Today: No acute events overnight.  Pt denies problems with ambulating, voiding or po intake.  She denies nausea or vomiting.  Pain is well controlled.  She has had flatus. She has had bowel movement.  Lochia Minimal.  Plan for birth control is  abstinence, condoms.  Method of Feeding: Breast milk  Physical Exam:  General: alert, cooperative and no distress Lochia: appropriate Uterine Fundus: firm DVT Evaluation: No evidence of DVT seen on physical exam. No significant calf/ankle edema.  H/H: Lab Results  Component Value Date/Time   HGB 11.7* 08/11/2014 02:25 AM   HGB 11.3* 06/25/2014 12:00 PM   HCT 35.6* 08/11/2014 02:25 AM    Discharge Diagnoses: Term Pregnancy-delivered  Discharge Information: Date: 08/13/2014 Activity: pelvic rest Diet: routine  Medications: PNV, Colace and tylenol, and resumed home allergy/asthma meds Condition: stable Instructions: refer to  handout Discharge to: home      Medication List    ASK your doctor about these medications        acetaminophen 325 MG tablet  Commonly known as:  TYLENOL  Take 325 mg by mouth every 6 (six) hours as needed for mild pain.     albuterol 108 (90 BASE) MCG/ACT inhaler  Commonly known as:  VENTOLIN HFA  Inhale 2 puffs into the lungs every 4 (four) hours as needed for wheezing.     loratadine 10 MG tablet  Commonly known as:  CLARITIN  Take 10 mg by mouth daily as needed for allergies.     omeprazole 20 MG capsule  Commonly known as:  PRILOSEC  Take 20 mg by mouth daily as needed (heartburn).     prenatal multivitamin Tabs tablet  Take 1 tablet by mouth at bedtime.        Caryl AdaJazma Phelps, DO 08/13/2014, 9:15 AM PGY-1, [facility]  CNM attestation I have seen and examined this patient and agree with above documentation in the resident's note.   Janet Hall is a 29 y.o. G1P1001 s/p SVD.   Pain is well controlled.  Plan for birth control is condoms.  Method of Feeding: breast  PE:  BP 103/73 mmHg  Pulse 83  Temp(Src) 97.9 F (36.6 C) (Oral)  Resp 20  Ht 5\' 10"  (1.778 m)  Wt 74.39 kg (164 lb)  BMI 23.53 kg/m2  SpO2 100%  LMP 11/01/2013  Breastfeeding? Unknown Fundus firm  No results for input(s): HGB, HCT in the last 72 hours.   Plan: discharge today - postpartum care discussed - f/u clinic in 6 weeks for postpartum visit   SHAW, KIMBERLY, CNM 12:16 PM

## 2014-08-13 NOTE — Discharge Instructions (Signed)
Please make sure to schedule post-partum visit at your [facility].  Congratulations on your new baby!

## 2014-09-09 ENCOUNTER — Ambulatory Visit: Payer: Medicaid Other | Admitting: Women's Health

## 2014-09-10 ENCOUNTER — Encounter: Payer: Self-pay | Admitting: Advanced Practice Midwife

## 2014-09-10 ENCOUNTER — Ambulatory Visit (INDEPENDENT_AMBULATORY_CARE_PROVIDER_SITE_OTHER): Payer: Medicaid Other | Admitting: Advanced Practice Midwife

## 2014-09-10 NOTE — Progress Notes (Addendum)
  Janet Hall is a 29 y.o. who presents for a postpartum visit. She is 4 weeks postpartum following a spontaneous vaginal delivery. I have fully reviewed the prenatal and intrapartum course. The delivery was at 40.3 gestational weeks.  Anesthesia: epidural. Postpartum course has been uneventful. She found a "knot" on her vagina, less tender  . Baby's course has been uneventful. Baby is feeding by bottle. Bleeding: no bleeding. Bowel function is normal. Bladder function is normal. Patient is not sexually active. Contraception method is condoms. Postpartum depression screening: negative.   Current outpatient prescriptions:  .  acetaminophen (TYLENOL) 325 MG tablet, Take 2 tablets (650 mg total) by mouth every 4 (four) hours as needed (for pain scale < 4)., Disp: 30 tablet, Rfl: 0 .  albuterol (VENTOLIN HFA) 108 (90 BASE) MCG/ACT inhaler, Inhale 2 puffs into the lungs every 4 (four) hours as needed for wheezing., Disp: 1 Inhaler, Rfl: 2 .  loratadine (CLARITIN) 10 MG tablet, Take 10 mg by mouth daily as needed for allergies. , Disp: , Rfl:  .  omeprazole (PRILOSEC) 20 MG capsule, Take 20 mg by mouth daily as needed (heartburn)., Disp: , Rfl:  .  Prenatal Vit-Fe Fumarate-FA (PRENATAL MULTIVITAMIN) TABS tablet, Take 1 tablet by mouth at bedtime., Disp: , Rfl:  .  docusate sodium (COLACE) 100 MG capsule, Take 1 capsule (100 mg total) by mouth 2 (two) times daily as needed for mild constipation. (Patient not taking: Reported on 09/10/2014), Disp: 30 capsule, Rfl: 0  Review of Systems   Constitutional: Negative for fever and chills Eyes: Negative for visual disturbances Respiratory: Negative for shortness of breath, dyspnea Cardiovascular: Negative for chest pain or palpitations  Gastrointestinal: Negative for vomiting, diarrhea and constipation Genitourinary: Negative for dysuria and urgency Musculoskeletal: Negative for back pain, joint pain, myalgias  Neurological: Negative for dizziness and  headaches   Objective:     Filed Vitals:   09/10/14 1206  BP: 120/78  Pulse: 76   General:  alert, cooperative and no distress   Breasts:  negative  Lungs: clear to auscultation bilaterally  Heart:  regular rate and rhythm  Abdomen: Soft, nontender   Vulva:  normal  Vagina: normal vagina.  "knot" is actually a tiny stitch remanent  Cervix:  closed  Corpus: Well involuted     Rectal Exam: no hemorrhoids        Assessment:    normal postpartum exam.  Plan:    1. Contraception: none 2. Follow up in:  or as needed.

## 2014-09-23 ENCOUNTER — Ambulatory Visit: Payer: Medicaid Other | Admitting: Advanced Practice Midwife

## 2014-09-23 ENCOUNTER — Encounter: Payer: Self-pay | Admitting: Advanced Practice Midwife

## 2014-09-23 ENCOUNTER — Ambulatory Visit (INDEPENDENT_AMBULATORY_CARE_PROVIDER_SITE_OTHER): Payer: Medicaid Other | Admitting: Advanced Practice Midwife

## 2014-09-23 VITALS — BP 108/66 | HR 84 | Ht 70.0 in | Wt 138.5 lb

## 2014-09-23 DIAGNOSIS — Z803 Family history of malignant neoplasm of breast: Secondary | ICD-10-CM

## 2014-09-23 DIAGNOSIS — R319 Hematuria, unspecified: Secondary | ICD-10-CM

## 2014-09-23 DIAGNOSIS — N920 Excessive and frequent menstruation with regular cycle: Secondary | ICD-10-CM

## 2014-09-23 DIAGNOSIS — Z1389 Encounter for screening for other disorder: Secondary | ICD-10-CM

## 2014-09-23 LAB — POCT URINALYSIS DIPSTICK
Glucose, UA: NEGATIVE
Ketones, UA: NEGATIVE
Leukocytes, UA: NEGATIVE
NITRITE UA: NEGATIVE
Protein, UA: 1
RBC UA: 3

## 2014-09-23 NOTE — Progress Notes (Signed)
   [facility] Visit  Patient name: Janet Hall MRN [id number]  Date of birth: 07-20-1985  CC & HPI:  Janet Hall is a 29 y.o. Caucasian female presenting today for what she thinks could be a vaginal infection. Has recently stopped breastfeeding She started bleeding Saturday, and had a whiff of "strong odor" that day.  Also has some pressure in L side, like when she would start her period.    Pertinent History Reviewed:  Medical & Surgical Hx:   Past Medical History  Diagnosis Date  . Migraine without aura     Onset in early teens.  Anywhere from 2 per week to 1 per month.    Marland Kitchen. GAD (generalized anxiety disorder) 11/21/2011  . Asthma, mild persistent    Past Surgical History  Procedure Laterality Date  . No past surgeries     Family History  Problem Relation Age of Onset  . Arthritis Mother   . Cancer Mother [id number]    breast  . Hypertension Father   . Arthritis Maternal Grandmother     liver cancer  . Arthritis Maternal Grandfather     stomach cancer  . Diabetes Paternal Grandmother   . Heart attack Paternal Grandmother   . Other Paternal Grandfather     vertigo    Current outpatient prescriptions:  .  albuterol (VENTOLIN HFA) 108 (90 BASE) MCG/ACT inhaler, Inhale 2 puffs into the lungs every 4 (four) hours as needed for wheezing., Disp: 1 Inhaler, Rfl: 2 .  loratadine (CLARITIN) 10 MG tablet, Take 10 mg by mouth daily as needed for allergies. , Disp: , Rfl:  .  omeprazole (PRILOSEC) 20 MG capsule, Take 20 mg by mouth daily as needed (heartburn)., Disp: , Rfl:  .  Prenatal Vit-Fe Fumarate-FA (PRENATAL MULTIVITAMIN) TABS tablet, Take 1 tablet by mouth at bedtime., Disp: , Rfl:  .  acetaminophen (TYLENOL) 325 MG tablet, Take 2 tablets (650 mg total) by mouth every 4 (four) hours as needed (for pain scale < 4). (Patient not taking: Reported on 09/23/2014), Disp: 30 tablet, Rfl: 0 .  amoxicillin (AMOXIL) 500 MG capsule, Take 500 mg by mouth 3 (three) times daily.,  Disp: , Rfl: 0 .  docusate sodium (COLACE) 100 MG capsule, Take 1 capsule (100 mg total) by mouth 2 (two) times daily as needed for mild constipation. (Patient not taking: Reported on 09/10/2014), Disp: 30 capsule, Rfl: 0 Social History: Reviewed -  reports that she has never smoked. She has never used smokeless tobacco.  Review of Systems:      Objective Findings:  Vitals: BP 108/66 mmHg  Pulse 84  Ht 5\' 10"  (1.778 m)  Wt 138 lb 8 oz (62.823 kg)  BMI 19.87 kg/m2  LMP 09/18/2014  Breastfeeding? No  Physical Examination: General appearance - alert, well appearing, and in no distress Mental status - alert, oriented to person, place, and time Abdomen - soft, nontender, nondistended, no masses Pelvic - SSE:  Large amount of menstrual blood, no odor at all. Cx non friable. Non tender to bimanual.    No results found for this or any previous visit (from the past 24 hour(s)).        Assessment & Plan:  A:   Normal vagina, probably first period. P:  No treatment needed.  CRESENZO-DISHMAN,Tisha Cline CNM 09/23/2014 3:30 PM

## 2014-12-25 ENCOUNTER — Ambulatory Visit (INDEPENDENT_AMBULATORY_CARE_PROVIDER_SITE_OTHER): Payer: Self-pay | Admitting: Family Medicine

## 2014-12-25 ENCOUNTER — Encounter: Payer: Self-pay | Admitting: Family Medicine

## 2014-12-25 ENCOUNTER — Ambulatory Visit: Payer: Medicaid Other | Admitting: Family Medicine

## 2014-12-25 VITALS — BP 132/85 | HR 90 | Temp 97.9°F | Resp 16 | Ht 70.0 in | Wt 129.0 lb

## 2014-12-25 DIAGNOSIS — J029 Acute pharyngitis, unspecified: Secondary | ICD-10-CM

## 2014-12-25 LAB — POCT RAPID STREP A (OFFICE): Rapid Strep A Screen: NEGATIVE

## 2014-12-25 NOTE — Progress Notes (Signed)
Pre visit review using our clinic review tool, if applicable. No additional management support is needed unless otherwise documented below in the visit note. 

## 2014-12-25 NOTE — Progress Notes (Signed)
OFFICE NOTE  12/25/2014  CC:  Chief Complaint  Patient presents with  . Sore Throat    HPI: Patient is a 29 y.o. Caucasian female who is here for sore throat. Onset bad ST 3 d/a, lots of allerg rhin w/PND lately.  Had HA at onset for a day. No cough.  Tm 100.3. Mild achiness of body, esp upper body--this has improved lately. No rash.   Pertinent PMH:  Past medical, surgical, social, and family history reviewed and no changes are noted since last office visit.  MEDS:  Outpatient Prescriptions Prior to Visit  Medication Sig Dispense Refill  . albuterol (VENTOLIN HFA) 108 (90 BASE) MCG/ACT inhaler Inhale 2 puffs into the lungs every 4 (four) hours as needed for wheezing. 1 Inhaler 2  . loratadine (CLARITIN) 10 MG tablet Take 10 mg by mouth daily as needed for allergies.     Marland Kitchen omeprazole (PRILOSEC) 20 MG capsule Take 20 mg by mouth daily as needed (heartburn).    Marland Kitchen acetaminophen (TYLENOL) 325 MG tablet Take 2 tablets (650 mg total) by mouth every 4 (four) hours as needed (for pain scale < 4). (Patient not taking: Reported on 09/23/2014) 30 tablet 0  . Prenatal Vit-Fe Fumarate-FA (PRENATAL MULTIVITAMIN) TABS tablet Take 1 tablet by mouth at bedtime.     No facility-administered medications prior to visit.    PE: Blood pressure 132/85, pulse 90, temperature 97.9 F (36.6 C), temperature source Oral, resp. rate 16, height  (1.778 m), weight 129 lb (58.514 kg), last menstrual period 12/15/2014, SpO2 100 %, not currently breastfeeding. VS: noted--normal. Gen: alert, NAD, WELL- APPEARING. HEENT: eyes without injection, drainage, or swelling.  Ears: EACs clear, TMs with normal light reflex and landmarks.  Nose: Clear rhinorrhea, with some dried, crusty exudate adherent to mildly injected mucosa.  No purulent d/c.  No paranasal sinus TTP.  No facial swelling.  Throat and mouth without focal lesion.  No pharyngial swelling, but soft palate erythema with scattered petechiae are noted.  No  exudate.  No tonsillar hypertrophy or asymmetry.   Neck: supple, no LAD.   LUNGS: CTA bilat, nonlabored resps.   CV: RRR, no m/r/g. EXT: no c/c/e SKIN: no rash  LAB: Rapid strep NEG  IMPRESSION AND PLAN:  Viral pharyngitis. Sent group A strep culture today. Discussed symptomatic care.  An After Visit Summary was printed and given to the patient.   FOLLOW UP: prn

## 2014-12-26 LAB — CULTURE, GROUP A STREP: Organism ID, Bacteria: NORMAL

## 2015-02-06 IMAGING — US US ABDOMEN COMPLETE
1 series · 14 of 25 positions shown · non-contrast
Comparison: None.

CLINICAL DATA: Nausea without vomiting

EXAM:
ULTRASOUND ABDOMEN COMPLETE

[Series 1: us abdomen complete · 0.21mm/px · 14 of 96 slices shown]
[im 1/96]
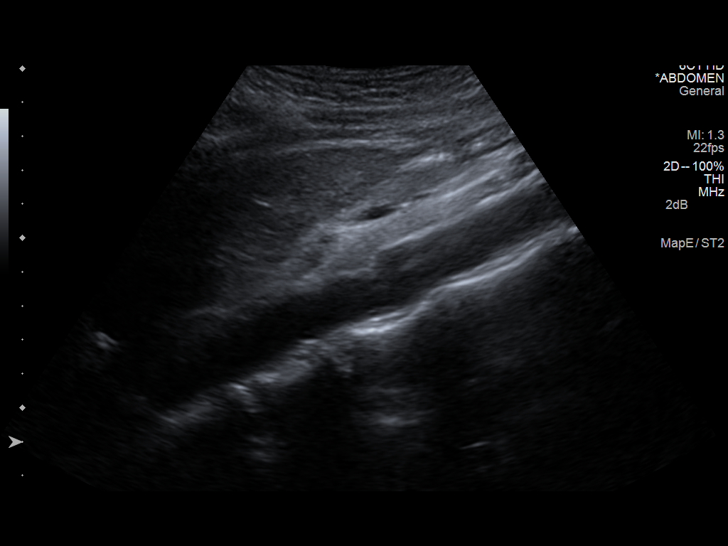
[im 8/96]
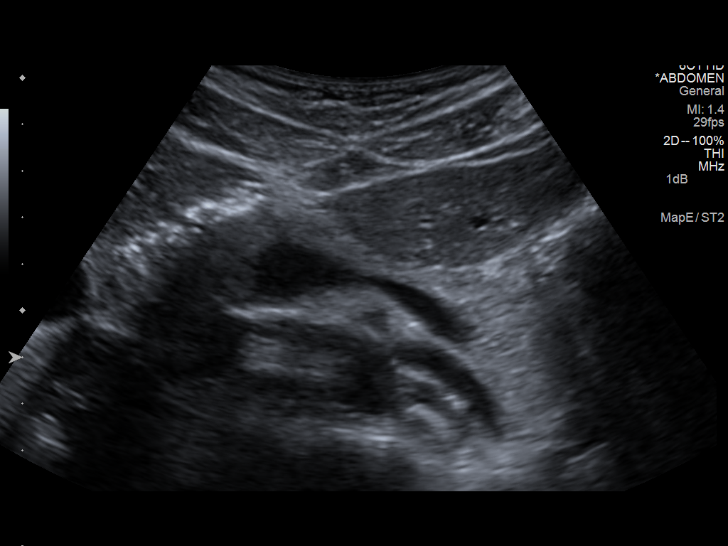
[im 16/96]
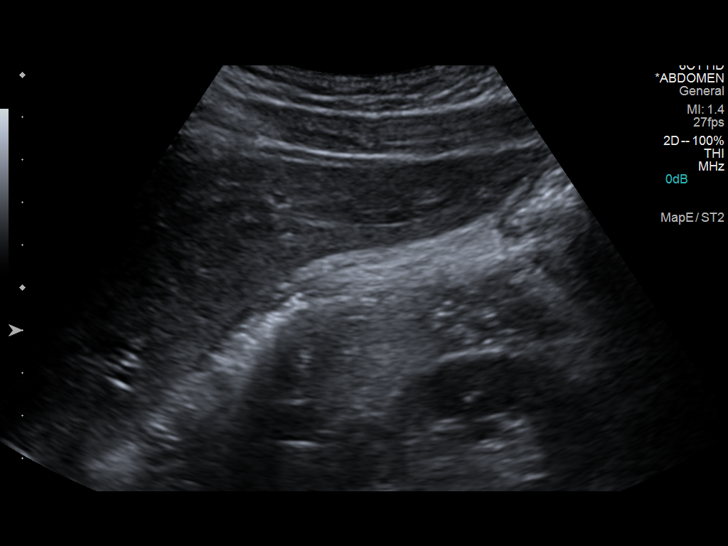
[im 24/96]
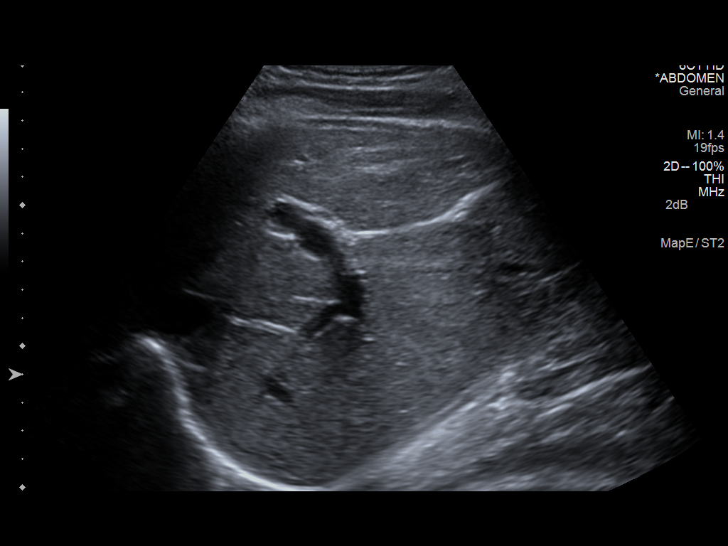
[im 32/96]
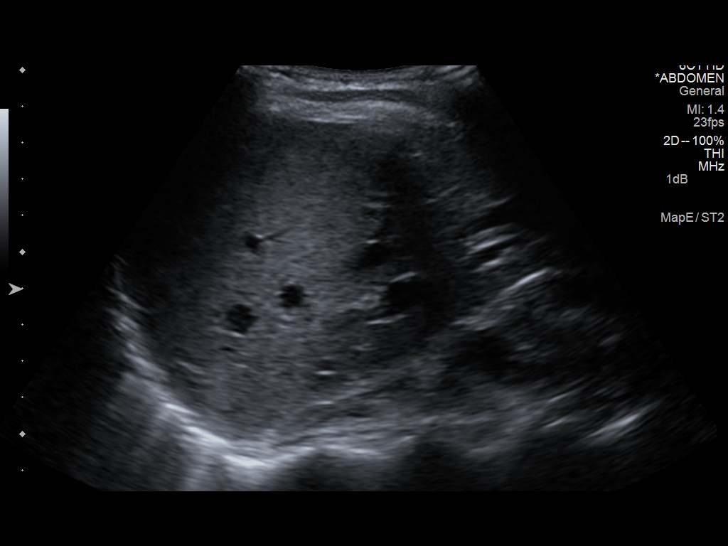
[im 36/96]
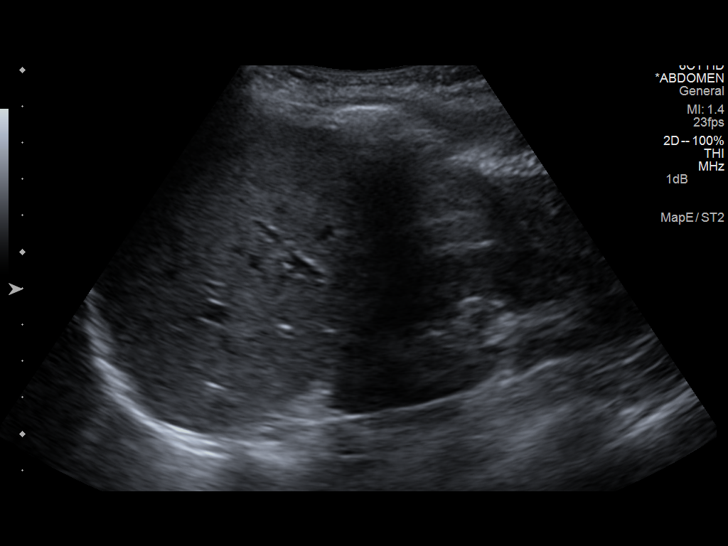
[im 44/96]
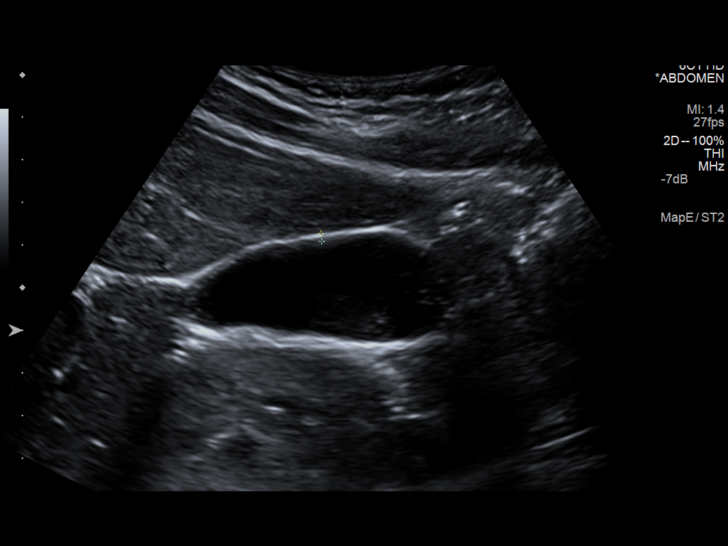
[im 52/96]
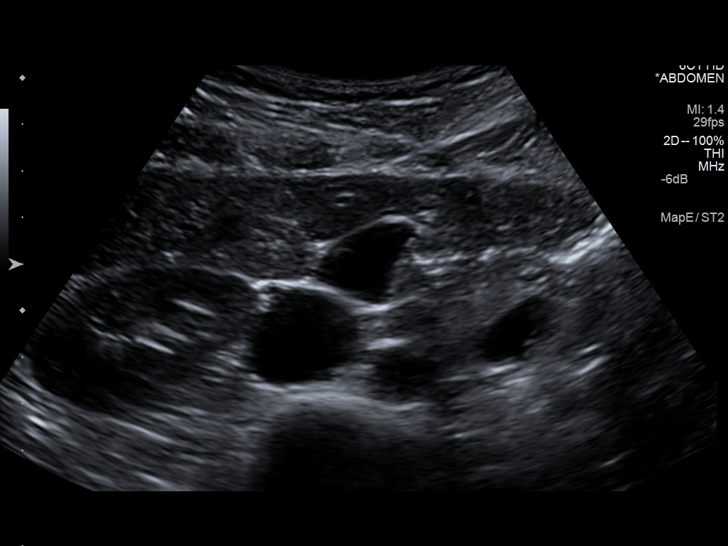
[im 60/96]
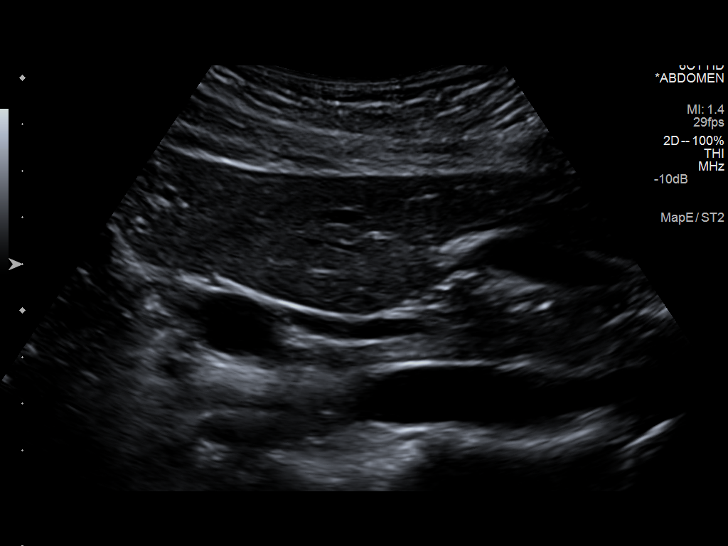
[im 64/96]
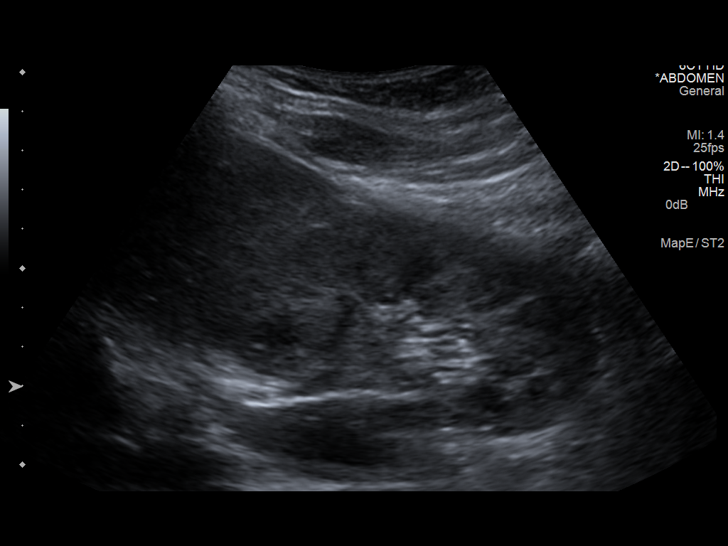
[im 72/96]
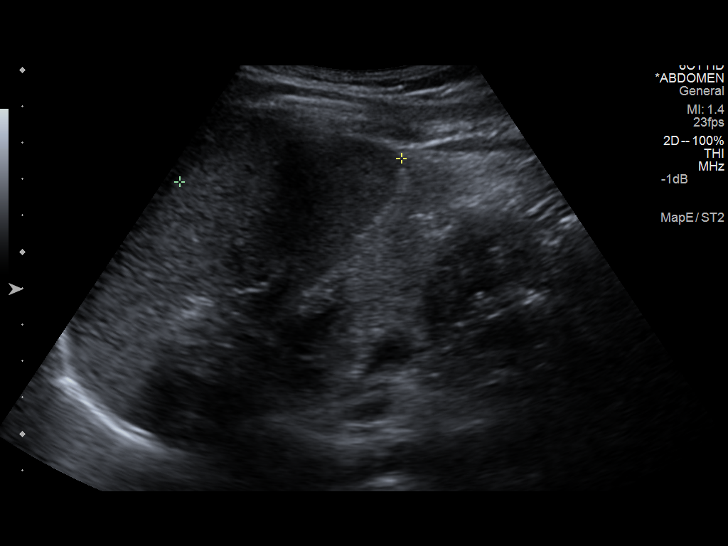
[im 80/96]
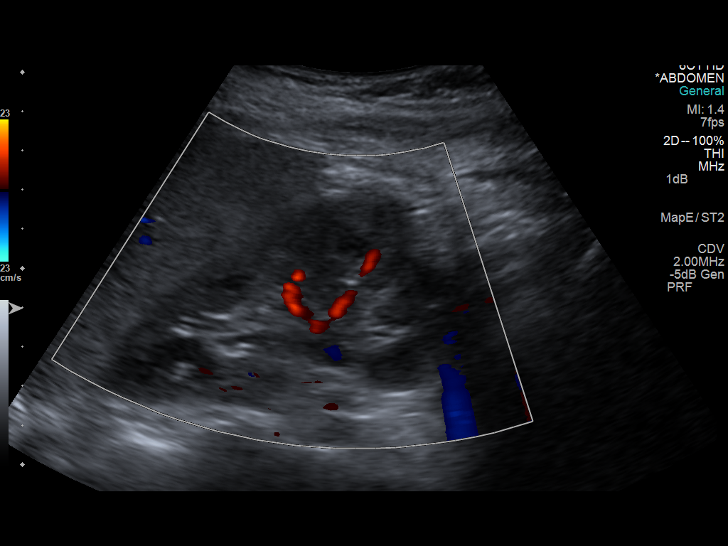
[im 88/96]
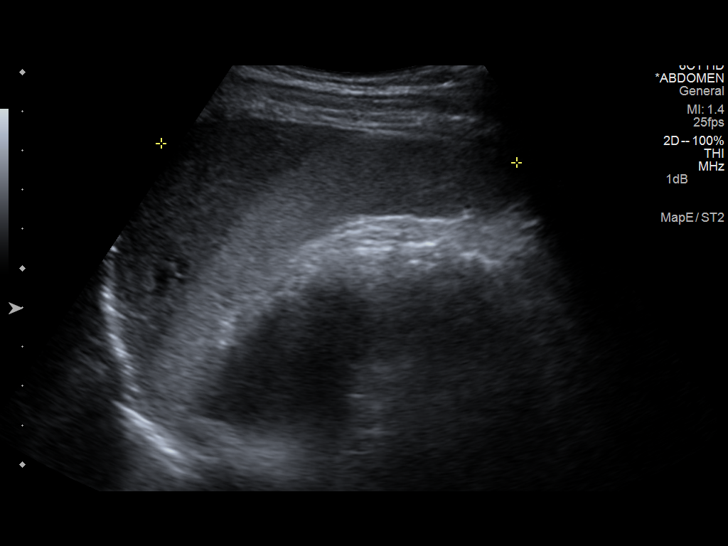
[im 96/96]
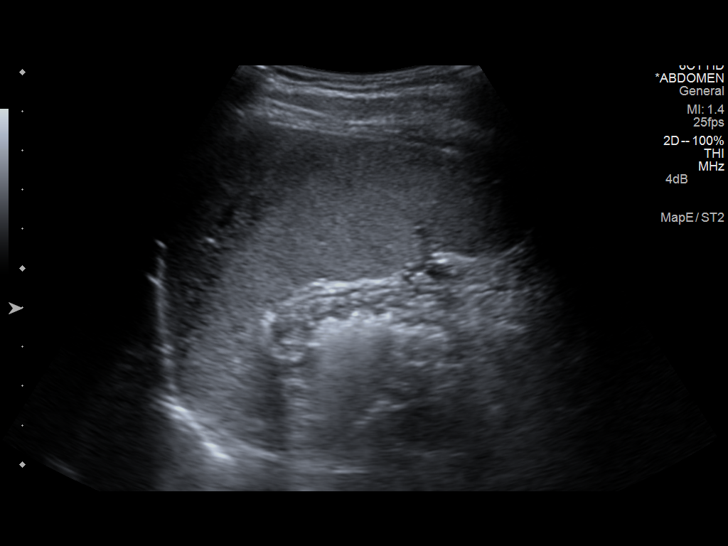

[14 of 25 positions shown; findings below may reference images not displayed]

FINDINGS: Gallbladder:

No gallstones or wall thickening visualized. No sonographic Murphy
sign noted.

Common bile duct:

Diameter: 4 mm

Liver:

No focal lesion identified. Within normal limits in parenchymal
echogenicity. Left lobe of the liver extends far laterally to the
left above the spleen.

IVC:

No abnormality visualized.

Pancreas:

Visualized portion unremarkable.

Spleen:

Size and appearance within normal limits.

Right Kidney:

Length: 10.3 cm. Echogenicity within normal limits. No mass or
hydronephrosis visualized.

Left Kidney:

Length: 10.6 cm. Echogenicity within normal limits. No mass or
hydronephrosis visualized.

Abdominal aorta:

No aneurysm visualized.

Other findings:

None.
IMPRESSION: Negative

## 2015-04-16 ENCOUNTER — Encounter: Payer: Self-pay | Admitting: Medical

## 2015-04-16 ENCOUNTER — Telehealth: Payer: Self-pay | Admitting: Medical

## 2015-04-16 NOTE — Progress Notes (Signed)
This encounter was created in error - please disregard.

## 2015-04-19 NOTE — Telephone Encounter (Signed)
Pt was no show acute appt 04/16/15 2:00pm, pt did not reschedule, charge or no charge?

## 2015-04-19 NOTE — Telephone Encounter (Signed)
charge 

## 2015-04-20 ENCOUNTER — Encounter: Payer: Self-pay | Admitting: Medical

## 2015-04-20 NOTE — Telephone Encounter (Signed)
Marked to charge, mailing letter °

## 2015-04-23 NOTE — Telephone Encounter (Addendum)
Patient states she left a message prior to appointment canceling and does't want to be charge

## 2015-11-15 ENCOUNTER — Other Ambulatory Visit: Payer: Self-pay | Admitting: Family Medicine

## 2015-11-15 MED ORDER — ALBUTEROL SULFATE HFA 108 (90 BASE) MCG/ACT IN AERS: 2.0000 | INHALATION_SPRAY | RESPIRATORY_TRACT | 1 refills | Status: DC | PRN

## 2015-11-15 NOTE — Telephone Encounter (Signed)
RF request for albuterol inhaler LOV: for asthma 12/17/12, acute visit for sore throat 12/25/14 Next ov: None Last written: 01/27/14 #1 w/ 2Rf  Please advise. Thanks.

## 2015-11-15 NOTE — Telephone Encounter (Signed)
Left detailed message on cell vm [facility] [place], okay per DPR.

## 2015-11-15 NOTE — Telephone Encounter (Signed)
Pt is calling to see if you will refill her inhaler. Her last appt was September 2016 but she does not currently have insurance. Won't have coverage until January. If she could have a rx for the Albuterol she uses [facility] in [place].

## 2015-12-08 ENCOUNTER — Other Ambulatory Visit (HOSPITAL_COMMUNITY)
Admission: RE | Admit: 2015-12-08 | Discharge: 2015-12-08 | Disposition: A | Discharge status: Home / Self Care | Payer: Self-pay | Source: Ambulatory Visit | Attending: Obstetrics & Gynecology | Admitting: Obstetrics & Gynecology

## 2015-12-08 ENCOUNTER — Encounter: Payer: Self-pay | Admitting: Women's Health

## 2015-12-08 ENCOUNTER — Ambulatory Visit (INDEPENDENT_AMBULATORY_CARE_PROVIDER_SITE_OTHER): Payer: Self-pay | Admitting: Women's Health

## 2015-12-08 VITALS — BP 128/82 | HR 102 | Ht 70.0 in | Wt 126.0 lb

## 2015-12-08 DIAGNOSIS — Z01419 Encounter for gynecological examination (general) (routine) without abnormal findings: Secondary | ICD-10-CM

## 2015-12-08 DIAGNOSIS — Z803 Family history of malignant neoplasm of breast: Secondary | ICD-10-CM

## 2015-12-08 NOTE — Progress Notes (Signed)
Subjective:   Janet Hall is a 30 y.o. G9P1001 Caucasian female here for a routine well-woman exam. She is self-pay. Her mom passed away recently w/ metastatic breast cancer, she was 30yo at dx. Was told to begin her mammograms at 31yo by oncologist.  Patient's last menstrual period was 11/13/2015.    Current complaints: none PCP: [facility]       Does not desire labs  Social History: Sexual: heterosexual Marital Status: married Living situation: with family Occupation: unemployed Tobacco/alcohol: no tobacco or etoh Illicit drugs: no history of illicit drug use  The following portions of the patient's history were reviewed and updated as appropriate: allergies, current medications, past family history, past medical history, past social history, past surgical history and problem list.  Past Medical History Past Medical History:  Diagnosis Date  . Asthma, mild persistent   . GAD (generalized anxiety disorder) 11/21/2011  . Migraine without aura    Onset in early teens.  Anywhere from 2 per week to 1 per month.      Past Surgical History Past Surgical History:  Procedure Laterality Date  . NO PAST SURGERIES      Gynecologic History G1P1001  Patient's last menstrual period was 11/13/2015. Contraception: condoms Last Pap: >77yrs ago. Results were: normal Last mammogram: never, has had 2 normal breast u/s d/t lump she felt in Lt breast w/ periods but would go away after. Results were: normal u/s x 2 Last TCS: never  Obstetric History OB History  Gravida Para Term Preterm AB Living  1 1 1     1   SAB TAB Ectopic Multiple Live Births        0 1    # Outcome Date GA Lbr Len/2nd Weight Sex Delivery Anes PTL Lv  1 Term 08/11/14 [redacted]w[redacted]d 10:35 / 00:54 7 lb 3 oz (3.26 kg) F Vag-Spont EPI  LIV     Birth Comments: none      Current Medications Current Outpatient Prescriptions on File Prior to Visit  Medication Sig Dispense Refill  . albuterol (VENTOLIN HFA) 108 (90 Base) MCG/ACT  inhaler Inhale 2 puffs into the lungs every 4 (four) hours as needed for wheezing. 1 Inhaler 1  . loratadine (CLARITIN) 10 MG tablet Take 10 mg by mouth daily as needed for allergies.      No current facility-administered medications on file prior to visit.     Review of Systems Patient denies any headaches, blurred vision, shortness of breath, chest pain, abdominal pain, problems with bowel movements, urination, or intercourse.  Objective:  BP 128/82 (BP Location: Right Arm, Patient Position: Sitting, Cuff Size: Normal)   Pulse (!) 102   Ht 5\' 10"  (1.778 m)   Wt 126 lb (57.2 kg)   LMP 11/13/2015   BMI 18.08 kg/m  Physical Exam  General:  Well developed, well nourished, no acute distress. She is alert and oriented x3. Skin:  Warm and dry Neck:  Midline trachea, no thyromegaly or nodules Cardiovascular: Regular rate and rhythm, no murmur heard Lungs:  Effort normal, all lung fields clear to auscultation bilaterally Breasts:  No dominant palpable mass, retraction, or nipple discharge Abdomen:  Soft, non tender, no hepatosplenomegaly or masses Pelvic:  External genitalia is normal in appearance.  The vagina is normal in appearance. The cervix is bulbous, no CMT.  Thin prep pap is done w/ reflex HR HPV cotesting. Uterus is felt to be normal size, shape, and contour.  No adnexal masses or tenderness noted. Extremities:  No swelling  or varicosities noted Psych:  She has a normal mood and affect  Assessment:   Healthy well-woman exam Mom recently passed w/ metastatic breast CA  Plan:  Declines labs F/U [age] for physical, or sooner if needed Mammogram @31yo  per oncologist d/t mom's hx, or sooner if problems Colonoscopy @30yo  or sooner if problems  Marge DuncansBooker, Kwasi Joung Randall CNM, WHNP-BC 12/08/2015 11:12 AM

## 2015-12-09 LAB — CYTOLOGY - PAP

## 2015-12-20 ENCOUNTER — Telehealth: Payer: Self-pay | Admitting: Women's Health

## 2015-12-20 NOTE — Telephone Encounter (Signed)
Pt called stating that she would like to know the results of her pap. Please contact pt °

## 2015-12-20 NOTE — Telephone Encounter (Signed)
Spoke with pt letting her know pap was normal. Pt voiced understanding. JSY 

## 2016-03-29 ENCOUNTER — Ambulatory Visit (INDEPENDENT_AMBULATORY_CARE_PROVIDER_SITE_OTHER): Payer: BLUE CROSS/BLUE SHIELD | Admitting: Advanced Practice Midwife

## 2016-03-29 ENCOUNTER — Encounter: Payer: Self-pay | Admitting: Advanced Practice Midwife

## 2016-03-29 VITALS — BP 131/85 | HR 123 | Wt 130.0 lb

## 2016-03-29 DIAGNOSIS — N390 Urinary tract infection, site not specified: Secondary | ICD-10-CM | POA: Diagnosis not present

## 2016-03-29 DIAGNOSIS — R310 Gross hematuria: Secondary | ICD-10-CM

## 2016-03-29 DIAGNOSIS — R300 Dysuria: Secondary | ICD-10-CM

## 2016-03-29 LAB — POCT URINALYSIS DIPSTICK
Glucose, UA: NEGATIVE
Ketones, UA: NEGATIVE
Leukocytes, UA: NEGATIVE
Nitrite, UA: NEGATIVE
PROTEIN UA: NEGATIVE

## 2016-03-29 MED ORDER — PHENAZOPYRIDINE HCL 200 MG PO TABS: 200.0000 mg | ORAL_TABLET | Freq: Three times a day (TID) | ORAL | 0 refills | Status: DC | PRN

## 2016-03-29 MED ORDER — SULFAMETHOXAZOLE-TRIMETHOPRIM 800-160 MG PO TABS: 1.0000 | ORAL_TABLET | Freq: Two times a day (BID) | ORAL | 0 refills | Status: DC

## 2016-03-29 NOTE — Progress Notes (Signed)
[facility] Visit  Patient name: Janet Hall MRN [id number]  Date of birth: 21-Nov-1985  CC & HPI:  Janet Hall is a 31 y.o. Caucasian female presenting today for burning with urination for a few days, some suprapubic.,    Pertinent History Reviewed:  Medical & Surgical Hx:   Past Medical History:  Diagnosis Date  . Asthma, mild persistent   . Family history of breast cancer in mother    Per oncology, pt should start mammograms at age 43.  Marland Kitchen GAD (generalized anxiety disorder) 11/21/2011  . Migraine without aura    Onset in early teens.  Anywhere from 2 per week to 1 per month.     Past Surgical History:  Procedure Laterality Date  . NO PAST SURGERIES     Family History  Problem Relation Age of Onset  . Arthritis Mother   . Cancer Mother 70    breast.  Died of metastatic breast cancer 2017.  Marland Kitchen Hypertension Father   . Arthritis Maternal Grandmother     liver cancer  . Arthritis Maternal Grandfather     stomach cancer  . Diabetes Paternal Grandmother   . Heart attack Paternal Grandmother   . Other Paternal Grandfather     vertigo    Current Outpatient Prescriptions:  .  albuterol (VENTOLIN HFA) 108 (90 Base) MCG/ACT inhaler, Inhale 2 puffs into the lungs every 4 (four) hours as needed for wheezing., Disp: 1 Inhaler, Rfl: 1 .  loratadine (CLARITIN) 10 MG tablet, Take 10 mg by mouth daily as needed for allergies. , Disp: , Rfl:  .  phenazopyridine (PYRIDIUM) 200 MG tablet, Take 1 tablet (200 mg total) by mouth 3 (three) times daily as needed for pain., Disp: 10 tablet, Rfl: 0 .  sulfamethoxazole-trimethoprim (BACTRIM DS,SEPTRA DS) 800-160 MG tablet, Take 1 tablet by mouth 2 (two) times daily., Disp: 10 tablet, Rfl: 0 Social History: Reviewed -  reports that she has never smoked. She has never used smokeless tobacco.  Review of Systems:   Constitutional: Negative for fever and chills Eyes: Negative for visual disturbances Respiratory: Negative for shortness of  breath, dyspnea Cardiovascular: Negative for chest pain or palpitations  Gastrointestinal: Negative for vomiting, diarrhea and constipation; no abdominal pain Genitourinary: Negative for  vaginal irritation or itching Musculoskeletal: Negative for back pain, joint pain, myalgias  Neurological: Negative for dizziness and headaches    Objective Findings:    Physical Examination: General appearance - well appearing, and in no distress Mental status - alert, oriented to person, place, and time Chest:  Normal respiratory effort Heart - normal rate and regular rhythm Abdomen:  Soft, nontender Musculoskeletal:  Normal range of motion without pain Extremities:  No edema    Results for orders placed or performed in visit on 03/29/16 (from the past 24 hour(s))  POCT urinalysis dipstick   Collection Time: 03/29/16  8:49 AM  Result Value Ref Range   Color, UA     Clarity, UA     Glucose, UA neg    Bilirubin, UA     Ketones, UA neg    Spec Grav, UA     Blood, UA large    pH, UA     Protein, UA neg    Urobilinogen, UA     Nitrite, UA neg    Leukocytes, UA Negative Negative      Assessment & Plan:  A:   UTI P:   Meds ordered this encounter  Medications  . DISCONTD: sulfamethoxazole-trimethoprim (BACTRIM  DS,SEPTRA DS) 800-160 MG tablet    Sig: Take 1 tablet by mouth 2 (two) times daily.    Dispense:  10 tablet    Refill:  0    Order Specific Question:   Supervising Provider    Answer:   Despina HiddenEURE, LUTHER H [[id number]]  . DISCONTD: phenazopyridine (PYRIDIUM) 200 MG tablet    Sig: Take 1 tablet (200 mg total) by mouth 3 (three) times daily as needed for pain.    Dispense:  10 tablet    Refill:  0    Order Specific Question:   Supervising Provider    Answer:   Despina HiddenEURE, LUTHER H [[id number]]  . sulfamethoxazole-trimethoprim (BACTRIM DS,SEPTRA DS) 800-160 MG tablet    Sig: Take 1 tablet by mouth 2 (two) times daily.    Dispense:  10 tablet    Refill:  0    Order Specific Question:    Supervising Provider    Answer:   Despina HiddenEURE, LUTHER H [[id number]]  . phenazopyridine (PYRIDIUM) 200 MG tablet    Sig: Take 1 tablet (200 mg total) by mouth 3 (three) times daily as needed for pain.    Dispense:  10 tablet    Refill:  0    Order Specific Question:   Supervising Provider    Answer:   Duane LopeEURE, LUTHER H [[id number]]      No Follow-up on file.  CRESENZO-DISHMAN,Jakobie Henslee CNM 03/29/2016 9:06 AM

## 2016-04-03 LAB — URINE CULTURE

## 2016-05-08 DIAGNOSIS — L918 Other hypertrophic disorders of the skin: Secondary | ICD-10-CM | POA: Diagnosis not present

## 2016-05-08 DIAGNOSIS — D225 Melanocytic nevi of trunk: Secondary | ICD-10-CM | POA: Diagnosis not present

## 2016-05-08 DIAGNOSIS — D2271 Melanocytic nevi of right lower limb, including hip: Secondary | ICD-10-CM | POA: Diagnosis not present

## 2016-05-08 DIAGNOSIS — D2262 Melanocytic nevi of left upper limb, including shoulder: Secondary | ICD-10-CM | POA: Diagnosis not present

## 2016-06-06 DIAGNOSIS — S335XXA Sprain of ligaments of lumbar spine, initial encounter: Secondary | ICD-10-CM | POA: Diagnosis not present

## 2016-06-06 DIAGNOSIS — R51 Headache: Secondary | ICD-10-CM | POA: Diagnosis not present

## 2016-06-06 DIAGNOSIS — S134XXA Sprain of ligaments of cervical spine, initial encounter: Secondary | ICD-10-CM | POA: Diagnosis not present

## 2016-07-18 ENCOUNTER — Telehealth: Payer: Self-pay | Admitting: *Deleted

## 2016-07-18 NOTE — Telephone Encounter (Addendum)
Patient called stating that she would like to establish care at the [place] office with Malva Cogan.   Patient states that this office is closer to her home.    Message routed to both providers for approval.   Patient is aware that once approval is given, she will be contacted to schedule a new patient  appointment with Selena Batten.

## 2016-07-18 NOTE — Telephone Encounter (Signed)
Patient aware - She stated that she will call back to schedule her NP appointment with [facility].

## 2016-07-18 NOTE — Telephone Encounter (Signed)
OK with me.

## 2016-07-18 NOTE — Telephone Encounter (Signed)
Ok with me 

## 2016-07-19 DIAGNOSIS — S134XXA Sprain of ligaments of cervical spine, initial encounter: Secondary | ICD-10-CM | POA: Diagnosis not present

## 2016-07-19 DIAGNOSIS — R51 Headache: Secondary | ICD-10-CM | POA: Diagnosis not present

## 2016-07-19 DIAGNOSIS — S335XXA Sprain of ligaments of lumbar spine, initial encounter: Secondary | ICD-10-CM | POA: Diagnosis not present

## 2016-07-25 ENCOUNTER — Encounter: Payer: Self-pay | Admitting: Physician Assistant

## 2016-07-25 ENCOUNTER — Ambulatory Visit (INDEPENDENT_AMBULATORY_CARE_PROVIDER_SITE_OTHER): Payer: BLUE CROSS/BLUE SHIELD | Admitting: Physician Assistant

## 2016-07-25 VITALS — BP 126/82 | HR 108 | Temp 99.2°F | Resp 14 | Ht 70.0 in | Wt 128.0 lb

## 2016-07-25 DIAGNOSIS — B9789 Other viral agents as the cause of diseases classified elsewhere: Secondary | ICD-10-CM

## 2016-07-25 DIAGNOSIS — J329 Chronic sinusitis, unspecified: Secondary | ICD-10-CM | POA: Diagnosis not present

## 2016-07-25 MED ORDER — FLUTICASONE PROPIONATE 50 MCG/ACT NA SUSP: 2.0000 | Freq: Every day | NASAL | 6 refills | Status: DC

## 2016-07-25 NOTE — Patient Instructions (Signed)
We are sorry that you are not feeling well.  Here is how we plan to help!  Based on what you have shared with me it looks like you have sinusitis.  Sinusitis is inflammation and infection in the sinus cavities of the head.  Based on your presentation I believe you most likely have Acute Viral Sinusitis.This is an infection most likely caused by a virus. There is not specific treatment for viral sinusitis other than to help you with the symptoms until the infection runs its course.  You may use an oral decongestant such as Mucinex D or if you have glaucoma or high blood pressure use plain Mucinex. Saline nasal spray help and can safely be used as often as needed for congestion, I have prescribed: Fluticasone nasal spray two sprays in each nostril twice a day  Some authorities believe that zinc sprays or the use of Echinacea may shorten the course of your symptoms.  Sinus infections are not as easily transmitted as other respiratory infection, however we still recommend that you avoid close contact with loved ones, especially the very young and elderly.  Remember to wash your hands thoroughly throughout the day as this is the number one way to prevent the spread of infection!  Home Care:  Only take medications as instructed by your medical team.  Do not take these medications with alcohol.  A steam or ultrasonic humidifier can help congestion.  You can place a towel over your head and breathe in the steam from hot water coming from a faucet.  Avoid close contacts especially the very young and the elderly.  Cover your mouth when you cough or sneeze.  Always remember to wash your hands.  Get Help Right Away If:  You develop worsening fever or sinus pain.  You develop a severe head ache or visual changes.  Your symptoms persist after you have completed your treatment plan.  Make sure you  Understand these instructions.  Will watch your condition.  Will get help right away if you are  not doing well or get worse.

## 2016-07-25 NOTE — Progress Notes (Signed)
Patient presents to clinic today c/o 3 days of sinus pressure with nasal congestion and yellow rhinorrhea. Denies fever, chills, chest congestion. Has noted some left ear pressure. Patient with history of asthma but denies any wheezing or tightness. Has taken some Claritin to help with symptoms. Endorses multiple family members with same symptoms at present.   Past Medical History:  Diagnosis Date  . Asthma, mild persistent   . Family history of breast cancer in mother    Per oncology, pt should start mammograms at age 62.  Marland Kitchen GAD (generalized anxiety disorder) 11/21/2011  . Migraine without aura    Onset in early teens.  Anywhere from 2 per week to 1 per month.      Current Outpatient Prescriptions on File Prior to Visit  Medication Sig Dispense Refill  . albuterol (VENTOLIN HFA) 108 (90 Base) MCG/ACT inhaler Inhale 2 puffs into the lungs every 4 (four) hours as needed for wheezing. 1 Inhaler 1  . loratadine (CLARITIN) 10 MG tablet Take 10 mg by mouth daily as needed for allergies.      No current facility-administered medications on file prior to visit.     No Known Allergies  Family History  Problem Relation Age of Onset  . Arthritis Mother   . Cancer Mother 15    breast.  Died of metastatic breast cancer 2017.  Marland Kitchen Hypertension Father   . Arthritis Maternal Grandmother     liver cancer  . Arthritis Maternal Grandfather     stomach cancer  . Diabetes Paternal Grandmother   . Heart attack Paternal Grandmother   . Other Paternal Grandfather     vertigo    Social History   Social History  . Marital status: Married    Spouse name: N/A  . Number of children: N/A  . Years of education: N/A   Social History Main Topics  . Smoking status: Never Smoker  . Smokeless tobacco: Never Used  . Alcohol use No  . Drug use: No  . Sexual activity: Yes    Birth control/ protection: None   Other Topics Concern  . None   Social History Narrative   Married, 1 child.   Educ: HS  at [employer].   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   No T/A/Ds.   Intermittent exercising.   Her mom passed away from metastatic breast cancer, summer 2017.   Review of Systems - See HPI.  All other ROS are negative.  BP 126/82   Pulse (!) 108   Temp 99.2 F (37.3 C) (Oral)   Resp 14   Ht  (1.778 m)   Wt 128 lb (58.1 kg)   SpO2 99%   BMI 18.37 kg/m   Physical Exam  Constitutional: She is oriented to person, place, and time and well-developed, well-nourished, and in no distress.  HENT:  Head: Normocephalic and atraumatic.  Right Ear: External ear normal.  Left Ear: External ear normal.  Nose: Rhinorrhea present. Right sinus exhibits no maxillary sinus tenderness and no frontal sinus tenderness. Left sinus exhibits no maxillary sinus tenderness and no frontal sinus tenderness.  Mouth/Throat: Oropharynx is clear and moist.  Eyes: Conjunctivae are normal.  Neck: Neck supple.  Cardiovascular: Normal rate, regular rhythm, normal heart sounds and intact distal pulses.   Pulmonary/Chest: Effort normal and breath sounds normal. No respiratory distress. She has no wheezes. She has no rales. She exhibits no tenderness.  Lymphadenopathy:    She has no cervical adenopathy.  Neurological: She is alert and oriented to person, place, and time.  Skin: Skin is warm and dry. No rash noted.  Psychiatric: Affect normal.  Vitals reviewed.  Assessment/Plan: 1. Viral sinusitis Rx Flonase. Supportive measures and OTC medications reviewed. Follow-up if not improving or if anything worsens over the next 48 hours. No indication for ABX at present.  - fluticasone (FLONASE) 50 MCG/ACT nasal spray; Place 2 sprays into both nostrils daily.  Dispense: 16 g; Refill: 6   Piedad Climes, [place]

## 2016-07-25 NOTE — Progress Notes (Signed)
Pre visit review using our clinic review tool, if applicable. No additional management support is needed unless otherwise documented below in the visit note. 

## 2016-07-27 ENCOUNTER — Telehealth: Payer: Self-pay | Admitting: Physician Assistant

## 2016-07-27 MED ORDER — AMOXICILLIN-POT CLAVULANATE 875-125 MG PO TABS: 1.0000 | ORAL_TABLET | Freq: Two times a day (BID) | ORAL | 0 refills | Status: DC

## 2016-07-27 NOTE — Telephone Encounter (Signed)
Advised patient the rx for the Augmentin was sent to the pharmacy. Follow up if symptoms have not improved after abx. She is agreeable.

## 2016-07-27 NOTE — Telephone Encounter (Signed)
Pt states that she is having some pressure with sinuses and asking for an abx be called in for her, [facility] [place].

## 2016-07-27 NOTE — Telephone Encounter (Signed)
We had discussed if symptoms were worsening we would start antibiotic. I have sent in Rx for Augmentin for her to take as directed.  Follow-up if symptoms are not resolving.

## 2016-08-07 DIAGNOSIS — R51 Headache: Secondary | ICD-10-CM | POA: Diagnosis not present

## 2016-08-07 DIAGNOSIS — S134XXA Sprain of ligaments of cervical spine, initial encounter: Secondary | ICD-10-CM | POA: Diagnosis not present

## 2016-08-07 DIAGNOSIS — S335XXA Sprain of ligaments of lumbar spine, initial encounter: Secondary | ICD-10-CM | POA: Diagnosis not present

## 2016-08-28 DIAGNOSIS — S134XXA Sprain of ligaments of cervical spine, initial encounter: Secondary | ICD-10-CM | POA: Diagnosis not present

## 2016-08-28 DIAGNOSIS — R51 Headache: Secondary | ICD-10-CM | POA: Diagnosis not present

## 2016-08-28 DIAGNOSIS — S335XXA Sprain of ligaments of lumbar spine, initial encounter: Secondary | ICD-10-CM | POA: Diagnosis not present

## 2016-09-25 ENCOUNTER — Ambulatory Visit (INDEPENDENT_AMBULATORY_CARE_PROVIDER_SITE_OTHER): Payer: BLUE CROSS/BLUE SHIELD | Admitting: Physician Assistant

## 2016-09-25 ENCOUNTER — Encounter: Payer: Self-pay | Admitting: Physician Assistant

## 2016-09-25 VITALS — BP 120/80 | HR 103 | Temp 99.0°F | Resp 14 | Ht 70.0 in | Wt 130.0 lb

## 2016-09-25 DIAGNOSIS — H6982 Other specified disorders of Eustachian tube, left ear: Secondary | ICD-10-CM

## 2016-09-25 NOTE — Patient Instructions (Signed)
Please restart Claritin daily and the Flonase. Use saline nasal rinses as we discussed. If symptoms are not improving/resolving in a couple of days, stop the Claritin and start an OTC Claritin D for a few days. If not resolving, please call or come see me.

## 2016-09-25 NOTE — Progress Notes (Signed)
Patient presents to clinic today c/o 3-4 days of L ear ache with pressure and popping. Has noted mild nasal drainage. Denies sinus pain, fever, chills, cough. Denies change in hearing or drainage from ear. Denies symptoms of R ear. Is not taking her claritin or Flonase as directed for allergies.  Past Medical History:  Diagnosis Date  . Asthma, mild persistent   . Family history of breast cancer in mother    Per oncology, pt should start mammograms at age 31.  Marland Kitchen. GAD (generalized anxiety disorder) 11/21/2011  . Migraine without aura    Onset in early teens.  Anywhere from 2 per week to 1 per month.      Current Outpatient Prescriptions on File Prior to Visit  Medication Sig Dispense Refill  . albuterol (VENTOLIN HFA) 108 (90 Base) MCG/ACT inhaler Inhale 2 puffs into the lungs every 4 (four) hours as needed for wheezing. 1 Inhaler 1  . fluticasone (FLONASE) 50 MCG/ACT nasal spray Place 2 sprays into both nostrils daily. 16 g 6  . loratadine (CLARITIN) 10 MG tablet Take 10 mg by mouth daily as needed for allergies.      No current facility-administered medications on file prior to visit.     No Known Allergies  Family History  Problem Relation Age of Onset  . Arthritis Mother   . Cancer Mother 1057       breast.  Died of metastatic breast cancer 2017.  Marland Kitchen. Hypertension Father   . Arthritis Maternal Grandmother        liver cancer  . Arthritis Maternal Grandfather        stomach cancer  . Diabetes Paternal Grandmother   . Heart attack Paternal Grandmother   . Other Paternal Grandfather        vertigo    Social History   Social History  . Marital status: Married    Spouse name: N/A  . Number of children: N/A  . Years of education: N/A   Social History Main Topics  . Smoking status: Never Smoker  . Smokeless tobacco: Never Used  . Alcohol use No  . Drug use: No  . Sexual activity: Yes    Birth control/ protection: None   Other Topics Concern  . None   Social History  Narrative   Married, 1 child.   Educ: HS at [employer].   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   No T/A/Ds.   Intermittent exercising.   Her mom passed away from metastatic breast cancer, summer 2017.   Review of Systems - See HPI.  All other ROS are negative.  BP 120/80   Pulse (!) 103   Temp 99 F (37.2 C) (Oral)   Resp 14   Ht 5\' 10"  (1.778 m)   Wt 130 lb (59 kg)   SpO2 99%   BMI 18.65 kg/m   Physical Exam  Constitutional: She is oriented to person, place, and time and well-developed, well-nourished, and in no distress.  HENT:  Head: Normocephalic and atraumatic.  Right Ear: Tympanic membrane normal.  Left Ear: Tympanic membrane is retracted. Tympanic membrane is not bulging. A middle ear effusion is present.  Nose: Nose normal. Right sinus exhibits no maxillary sinus tenderness and no frontal sinus tenderness. Left sinus exhibits no maxillary sinus tenderness and no frontal sinus tenderness.  Mouth/Throat: Uvula is midline, oropharynx is clear and moist and mucous membranes are normal.  Eyes: Conjunctivae are normal.  Neck: Neck supple.  Cardiovascular: Normal rate,  regular rhythm, normal heart sounds and intact distal pulses.   Pulmonary/Chest: Effort normal and breath sounds normal. No respiratory distress. She has no wheezes. She has no rales. She exhibits no tenderness.  Neurological: She is alert and oriented to person, place, and time.  Skin: Skin is warm and dry. No rash noted.  Psychiatric: Affect normal.  Vitals reviewed.  Assessment/Plan: 1. Eustachian tube dysfunction, left Restart Claritin and Flonase daily. Saline nasal rinses. Supportive measures reviewed. If not improving within a few days, patient to change Claritin to Claritin D for a few days. Follow-up if not resolving.    Janet Climes, PA-C

## 2016-09-25 NOTE — Progress Notes (Signed)
Pre visit review using our clinic review tool, if applicable. No additional management support is needed unless otherwise documented below in the visit note. 

## 2016-10-03 DIAGNOSIS — R51 Headache: Secondary | ICD-10-CM | POA: Diagnosis not present

## 2016-10-03 DIAGNOSIS — S134XXA Sprain of ligaments of cervical spine, initial encounter: Secondary | ICD-10-CM | POA: Diagnosis not present

## 2016-10-03 DIAGNOSIS — S335XXA Sprain of ligaments of lumbar spine, initial encounter: Secondary | ICD-10-CM | POA: Diagnosis not present

## 2016-10-25 DIAGNOSIS — S335XXA Sprain of ligaments of lumbar spine, initial encounter: Secondary | ICD-10-CM | POA: Diagnosis not present

## 2016-10-25 DIAGNOSIS — R51 Headache: Secondary | ICD-10-CM | POA: Diagnosis not present

## 2016-10-25 DIAGNOSIS — S134XXA Sprain of ligaments of cervical spine, initial encounter: Secondary | ICD-10-CM | POA: Diagnosis not present

## 2016-11-14 DIAGNOSIS — S134XXA Sprain of ligaments of cervical spine, initial encounter: Secondary | ICD-10-CM | POA: Diagnosis not present

## 2016-11-14 DIAGNOSIS — R51 Headache: Secondary | ICD-10-CM | POA: Diagnosis not present

## 2016-11-14 DIAGNOSIS — S335XXA Sprain of ligaments of lumbar spine, initial encounter: Secondary | ICD-10-CM | POA: Diagnosis not present

## 2016-12-08 ENCOUNTER — Other Ambulatory Visit: Payer: BLUE CROSS/BLUE SHIELD | Admitting: Women's Health

## 2016-12-11 ENCOUNTER — Other Ambulatory Visit: Payer: BLUE CROSS/BLUE SHIELD | Admitting: Women's Health

## 2016-12-20 ENCOUNTER — Other Ambulatory Visit: Payer: BLUE CROSS/BLUE SHIELD | Admitting: Women's Health

## 2016-12-25 DIAGNOSIS — S335XXA Sprain of ligaments of lumbar spine, initial encounter: Secondary | ICD-10-CM | POA: Diagnosis not present

## 2016-12-25 DIAGNOSIS — R51 Headache: Secondary | ICD-10-CM | POA: Diagnosis not present

## 2016-12-25 DIAGNOSIS — S134XXA Sprain of ligaments of cervical spine, initial encounter: Secondary | ICD-10-CM | POA: Diagnosis not present

## 2017-01-04 ENCOUNTER — Other Ambulatory Visit (HOSPITAL_COMMUNITY)
Admission: RE | Admit: 2017-01-04 | Discharge: 2017-01-04 | Disposition: A | Discharge status: Home / Self Care | Payer: BLUE CROSS/BLUE SHIELD | Source: Ambulatory Visit | Attending: Obstetrics & Gynecology | Admitting: Obstetrics & Gynecology

## 2017-01-04 ENCOUNTER — Ambulatory Visit (INDEPENDENT_AMBULATORY_CARE_PROVIDER_SITE_OTHER): Payer: BLUE CROSS/BLUE SHIELD | Admitting: Women's Health

## 2017-01-04 ENCOUNTER — Encounter: Payer: Self-pay | Admitting: Women's Health

## 2017-01-04 VITALS — BP 102/60 | HR 96 | Ht 70.0 in | Wt 128.0 lb

## 2017-01-04 DIAGNOSIS — Z3009 Encounter for other general counseling and advice on contraception: Secondary | ICD-10-CM | POA: Diagnosis not present

## 2017-01-04 DIAGNOSIS — Z803 Family history of malignant neoplasm of breast: Secondary | ICD-10-CM

## 2017-01-04 DIAGNOSIS — Z01411 Encounter for gynecological examination (general) (routine) with abnormal findings: Secondary | ICD-10-CM

## 2017-01-04 DIAGNOSIS — Z01419 Encounter for gynecological examination (general) (routine) without abnormal findings: Secondary | ICD-10-CM | POA: Insufficient documentation

## 2017-01-04 DIAGNOSIS — R350 Frequency of micturition: Secondary | ICD-10-CM

## 2017-01-04 NOTE — Progress Notes (Signed)
   [facility]  Patient name: Janet Hall MRN [id number]  Date of birth: December 16, 1985 CC & HPI:  Janet Hall is a 31 y.o. G75P1001 Caucasian female being seen today for a routine well-woman exam. She does have FP Mcaid as secondary, however she refuses the HIV/RPR- does not like blood work, all was neg 57yr ago w/ pregnancy, and in a monogamous relationship-plans to cancel FP Mcaid.  Current complaints: increased urinary frequency, no dysuria/urgency/hesitancy. Doesn't feel like it is a uti.   PCP: Silvestre Gunner      does not desire labs Patient's last menstrual period was 12/20/2016. The current method of family planning is condoms, but they are thinking of trying again soon for pregnancy Last pap 12/08/15. Results were: normal, requests another pap today Last mammogram: had 2 breast u/s few years ago for lump. Results were: normal. Her mom was dx w/ breast ca @ 57yo and passed away, pt was told to begin screening mammos at 31yo Last colonoscopy: never. Results were: n/a  Review of Systems:   Denies any headaches, blurred vision, fatigue, shortness of breath, chest pain, abdominal pain, abnormal vaginal discharge/itching/odor/irritation, problems with periods, bowel movements, urination, or intercourse unless otherwise stated above.  Pertinent History Reviewed:  Reviewed past medical,surgical, social and family history.  Reviewed problem list, medications and allergies. Objective Findings:   Vitals:   01/04/17 1334  BP: 102/60  Pulse: 96  Weight: 128 lb (58.1 kg)  Height:  (1.778 m)  Body mass index is 18.37 kg/m.  Physical Examination: General appearance - well appearing, and in no distress Mental status - alert, oriented to person, place, and time Psych:  She has a normal mood and affect Skin - warm and dry, normal color, no suspicious lesions noted Chest - effort normal, all lung fields clear to auscultation bilaterally Heart - normal rate and regular  rhythm Neck:  midline trachea, no thyromegaly or nodules Breasts - breasts appear normal, no suspicious masses, no skin or nipple changes or  axillary nodes Abdomen - soft, nontender, nondistended, no masses or organomegaly Pelvic - VULVA: normal appearing vulva with no masses, tenderness or lesions  VAGINA: normal appearing vagina with normal color and discharge, no lesions  CERVIX: normal appearing cervix without discharge or lesions, no CMT  Thin prep pap is done per pt request w/ HR HPV cotesting  UTERUS: uterus is felt to be normal size, shape, consistency and nontender   ADNEXA: No adnexal masses or tenderness noted. Extremities:  No swelling or varicosities noted  Urine dipstick: neg Assessment & Plan:  1) Healthy Well-Woman Exam 2) Urinary frequency>will send urine cx 3) Family h/o breast CA- mom dx @ 57yo, passed w/ metastatic dz> to begin screening mammo now> I called [facility] to schedule, however pt's daughter pushed the receiver and hung up, pt wants to call them again later to schedule herself 4) Contemplating another pregnancy> to begin pnv daily now  GC/CT on pap today Mammogram- pt to call to schedule Colonoscopy  or sooner if problems  Orders Placed This Encounter  Procedures  . Urine Culture    Return in about 1 year (around 01/04/2018) for Physical.  Marge Duncans CNM, WHNP-BC 01/04/2017 2:15 PM

## 2017-01-04 NOTE — Patient Instructions (Signed)
Start a prenatal vitamin daily Schedule your mammogram

## 2017-01-04 NOTE — Addendum Note (Signed)
Addended by: Tish Frederickson A on: 01/04/2017 02:26 PM   Modules accepted: Orders

## 2017-01-06 LAB — URINE CULTURE: ORGANISM ID, BACTERIA: NO GROWTH

## 2017-01-09 LAB — CYTOLOGY - PAP
Chlamydia: NEGATIVE
Diagnosis: NEGATIVE
HPV (WINDOPATH): NOT DETECTED
Neisseria Gonorrhea: NEGATIVE

## 2017-01-15 DIAGNOSIS — S134XXA Sprain of ligaments of cervical spine, initial encounter: Secondary | ICD-10-CM | POA: Diagnosis not present

## 2017-01-15 DIAGNOSIS — R51 Headache: Secondary | ICD-10-CM | POA: Diagnosis not present

## 2017-01-15 DIAGNOSIS — S335XXA Sprain of ligaments of lumbar spine, initial encounter: Secondary | ICD-10-CM | POA: Diagnosis not present

## 2017-01-16 ENCOUNTER — Telehealth: Payer: Self-pay | Admitting: Women's Health

## 2017-01-16 NOTE — Telephone Encounter (Signed)
Patient called stating that she would like to know the results of her pap and physical. Please contact pt

## 2017-01-16 NOTE — Telephone Encounter (Signed)
LMOVM that pap smear and labs were normal.

## 2017-01-24 DIAGNOSIS — S335XXA Sprain of ligaments of lumbar spine, initial encounter: Secondary | ICD-10-CM | POA: Diagnosis not present

## 2017-01-24 DIAGNOSIS — R51 Headache: Secondary | ICD-10-CM | POA: Diagnosis not present

## 2017-01-24 DIAGNOSIS — S134XXA Sprain of ligaments of cervical spine, initial encounter: Secondary | ICD-10-CM | POA: Diagnosis not present

## 2017-02-05 ENCOUNTER — Other Ambulatory Visit: Payer: Self-pay | Admitting: Emergency Medicine

## 2017-02-05 DIAGNOSIS — J4520 Mild intermittent asthma, uncomplicated: Secondary | ICD-10-CM

## 2017-02-05 MED ORDER — ALBUTEROL SULFATE HFA 108 (90 BASE) MCG/ACT IN AERS: 2.0000 | INHALATION_SPRAY | RESPIRATORY_TRACT | 5 refills | Status: DC | PRN

## 2017-02-27 ENCOUNTER — Ambulatory Visit: Payer: BLUE CROSS/BLUE SHIELD | Admitting: Physician Assistant

## 2017-02-27 ENCOUNTER — Encounter: Payer: Self-pay | Admitting: Physician Assistant

## 2017-02-27 VITALS — BP 100/70 | HR 100 | Temp 99.1°F | Resp 14 | Ht 70.0 in | Wt 127.0 lb

## 2017-02-27 DIAGNOSIS — S134XXA Sprain of ligaments of cervical spine, initial encounter: Secondary | ICD-10-CM | POA: Diagnosis not present

## 2017-02-27 DIAGNOSIS — F411 Generalized anxiety disorder: Secondary | ICD-10-CM

## 2017-02-27 DIAGNOSIS — J019 Acute sinusitis, unspecified: Secondary | ICD-10-CM | POA: Diagnosis not present

## 2017-02-27 DIAGNOSIS — Z862 Personal history of diseases of the blood and blood-forming organs and certain disorders involving the immune mechanism: Secondary | ICD-10-CM | POA: Diagnosis not present

## 2017-02-27 DIAGNOSIS — R51 Headache: Secondary | ICD-10-CM | POA: Diagnosis not present

## 2017-02-27 DIAGNOSIS — B9689 Other specified bacterial agents as the cause of diseases classified elsewhere: Secondary | ICD-10-CM | POA: Insufficient documentation

## 2017-02-27 DIAGNOSIS — S335XXA Sprain of ligaments of lumbar spine, initial encounter: Secondary | ICD-10-CM | POA: Diagnosis not present

## 2017-02-27 MED ORDER — SERTRALINE HCL 25 MG PO TABS: 25.0000 mg | ORAL_TABLET | Freq: Every day | ORAL | 3 refills | Status: DC

## 2017-02-27 MED ORDER — AMOXICILLIN-POT CLAVULANATE 875-125 MG PO TABS: 1.0000 | ORAL_TABLET | Freq: Two times a day (BID) | ORAL | 0 refills | Status: DC

## 2017-02-27 NOTE — Patient Instructions (Signed)
Please go to the lab today for blood work.  I will call you with your results. We will alter treatment regimen(s) if indicated by your results.   Please start the Sertraline, taking 1 tablet daily as directed. Download the [employer]Space app on your phone and try out some of these exercises.  Please take antibiotic as directed.  Increase fluid intake.  Use Saline nasal spray.  Take a daily multivitamin. Restart Claritin and Flonase and use daily.  Place a humidifier in the bedroom.  Please call or return clinic if symptoms are not improving.  Sinusitis Sinusitis is redness, soreness, and swelling (inflammation) of the paranasal sinuses. Paranasal sinuses are air pockets within the bones of your face (beneath the eyes, the middle of the forehead, or above the eyes). In healthy paranasal sinuses, mucus is able to drain out, and air is able to circulate through them by way of your nose. However, when your paranasal sinuses are inflamed, mucus and air can become trapped. This can allow bacteria and other germs to grow and cause infection. Sinusitis can develop quickly and last only a short time (acute) or continue over a long period (chronic). Sinusitis that lasts for more than 12 weeks is considered chronic.  CAUSES  Causes of sinusitis include:  Allergies.  Structural abnormalities, such as displacement of the cartilage that separates your nostrils (deviated septum), which can decrease the air flow through your nose and sinuses and affect sinus drainage.  Functional abnormalities, such as when the small hairs (cilia) that line your sinuses and help remove mucus do not work properly or are not present. SYMPTOMS  Symptoms of acute and chronic sinusitis are the same. The primary symptoms are pain and pressure around the affected sinuses. Other symptoms include:  Upper toothache.  Earache.  Headache.  Bad breath.  Decreased sense of smell and taste.  A cough, which worsens when you are lying  flat.  Fatigue.  Fever.  Thick drainage from your nose, which often is green and may contain pus (purulent).  Swelling and warmth over the affected sinuses. DIAGNOSIS  Your caregiver will perform a physical exam. During the exam, your caregiver may:  Look in your nose for signs of abnormal growths in your nostrils (nasal polyps).  Tap over the affected sinus to check for signs of infection.  View the inside of your sinuses (endoscopy) with a special imaging device with a light attached (endoscope), which is inserted into your sinuses. If your caregiver suspects that you have chronic sinusitis, one or more of the following tests may be recommended:  Allergy tests.  Nasal culture A sample of mucus is taken from your nose and sent to a lab and screened for bacteria.  Nasal cytology A sample of mucus is taken from your nose and examined by your caregiver to determine if your sinusitis is related to an allergy. TREATMENT  Most cases of acute sinusitis are related to a viral infection and will resolve on their own within 10 days. Sometimes medicines are prescribed to help relieve symptoms (pain medicine, decongestants, nasal steroid sprays, or saline sprays).  However, for sinusitis related to a bacterial infection, your caregiver will prescribe antibiotic medicines. These are medicines that will help kill the bacteria causing the infection.  Rarely, sinusitis is caused by a fungal infection. In theses cases, your caregiver will prescribe antifungal medicine. For some cases of chronic sinusitis, surgery is needed. Generally, these are cases in which sinusitis recurs more than 3 times per year, despite  other treatments. HOME CARE INSTRUCTIONS   Drink plenty of water. Water helps thin the mucus so your sinuses can drain more easily.  Use a humidifier.  Inhale steam 3 to 4 times a day (for example, sit in the bathroom with the shower running).  Apply a warm, moist washcloth to your face 3  to 4 times a day, or as directed by your caregiver.  Use saline nasal sprays to help moisten and clean your sinuses.  Take over-the-counter or prescription medicines for pain, discomfort, or fever only as directed by your caregiver. SEEK IMMEDIATE MEDICAL CARE IF:  You have increasing pain or severe headaches.  You have nausea, vomiting, or drowsiness.  You have swelling around your face.  You have vision problems.  You have a stiff neck.  You have difficulty breathing. MAKE SURE YOU:   Understand these instructions.  Will watch your condition.  Will get help right away if you are not doing well or get worse. Document Released: 03/13/2005 Document Revised: 06/05/2011 Document Reviewed: 03/28/2011 [facility] Patient Information 2014 [place] [place].

## 2017-02-27 NOTE — Assessment & Plan Note (Signed)
Repeat labs today. Will restart treatment if indicated by results.

## 2017-02-27 NOTE — Assessment & Plan Note (Signed)
Rx Augmentin.  Increase fluids.  Rest.  Saline nasal spray.  Probiotic.  Mucinex as directed.  Humidifier in bedroom. Patient to restart her Flonase and Claritin.  Call or return to clinic if symptoms are not improving.

## 2017-02-27 NOTE — Assessment & Plan Note (Signed)
Discussed treatment options. Handout on [facility] counseling services given. She is going to call and set up appointment. Will start Sertraline 25 mg daily. Follow-up scheduled. Alarm signs/symptoms reviewed that would prompt immediate return or ER assessment. Patient voiced understanding and agreement with the plan.

## 2017-02-27 NOTE — Progress Notes (Signed)
Patient presents to clinic today c/o 3.5 weeks of sinus pressure and sinus pain associated with significant PND. PND is occasionally causing nausea. Denies chest congestion but has noted a mild, dry cough. Unsure of fever. Denies recent travel or sick contact. Has noted a week of tooth pain. Has history of environmental allergies but has not taken her Claritin or Flonase regularly since this has started.  Patient also notes long-standing history of generalized anxiety, worsening after the death of her mother. States she typically feels anxious or on edge throughout the day. Is affecting appetite. Is sleeping ok. Notes occasional bad day but overall denies depressed mood or anhedonia. Denies SI/HI  Past Medical History:  Diagnosis Date  . Asthma, mild persistent   . Family history of breast cancer in mother    Per oncology, pt should start mammograms at age 31.  Marland Kitchen. GAD (generalized anxiety disorder) 11/21/2011  . Migraine without aura    Onset in early teens.  Anywhere from 2 per week to 1 per month.      Current Outpatient Medications on File Prior to Visit  Medication Sig Dispense Refill  . albuterol (VENTOLIN HFA) 108 (90 Base) MCG/ACT inhaler Inhale 2 puffs every 4 (four) hours as needed into the lungs for wheezing. 18 g 5  . fluticasone (FLONASE) 50 MCG/ACT nasal spray Place 2 sprays into both nostrils daily. 16 g 6  . loratadine (CLARITIN) 10 MG tablet Take 10 mg by mouth daily as needed for allergies.      No current facility-administered medications on file prior to visit.     No Known Allergies  Family History  Problem Relation Age of Onset  . Arthritis Mother   . Cancer Mother [id number]       breast.  Died of metastatic breast cancer 2017.  Marland Kitchen. Hypertension Father   . Arthritis Maternal Grandmother        liver cancer  . Arthritis Maternal Grandfather        stomach cancer  . Diabetes Paternal Grandmother   . Heart attack Paternal Grandmother   . Other Paternal Grandfather       vertigo    Social History   Socioeconomic History  . Marital status: Married    Spouse name: None  . Number of children: None  . Years of education: None  . Highest education level: None  Social Needs  . Financial resource strain: None  . Food insecurity - worry: None  . Food insecurity - inability: None  . Transportation needs - medical: None  . Transportation needs - non-medical: None  Occupational History  . None  Tobacco Use  . Smoking status: Never Smoker  . Smokeless tobacco: Never Used  Substance and Sexual Activity  . Alcohol use: No  . Drug use: No  . Sexual activity: Yes    Birth control/protection: None  Other Topics Concern  . None  Social History Narrative   Married, 1 child.   Educ: HS at [employer].   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   No T/A/Ds.   Intermittent exercising.   Her mom passed away from metastatic breast cancer, summer 2017.    Review of Systems - See HPI.  All other ROS are negative.  BP 100/70   Pulse 100   Temp 99.1 F (37.3 C) (Oral)   Resp 14   Ht 5\' 10"  (1.778 m)   Wt 127 lb (57.6 kg)   SpO2 99%   BMI 18.22 kg/m  Physical Exam  Constitutional: She is oriented to person, place, and time and well-developed, well-nourished, and in no distress.  HENT:  Head: Normocephalic and atraumatic.  Right Ear: Tympanic membrane normal.  Left Ear: Left ear middle ear effusion: serous.  Nose: Mucosal edema and rhinorrhea present. Right sinus exhibits maxillary sinus tenderness. Left sinus exhibits maxillary sinus tenderness.  Mouth/Throat: Uvula is midline, oropharynx is clear and moist and mucous membranes are normal.  Eyes: Conjunctivae are normal.  Neck: Neck supple. No thyromegaly present.  Cardiovascular: Normal rate, regular rhythm, normal heart sounds and intact distal pulses.  Pulmonary/Chest: Effort normal and breath sounds normal. No respiratory distress. She has no wheezes. She has no rales. She exhibits no  tenderness.  Lymphadenopathy:    She has no cervical adenopathy.  Neurological: She is alert and oriented to person, place, and time.  Skin: Skin is warm and dry. No rash noted.  Psychiatric: Her mood appears anxious. Her affect is not labile. She is not agitated. She does not exhibit a depressed mood. She expresses no homicidal and no suicidal ideation. She expresses no suicidal plans and no homicidal plans.  Vitals reviewed.  Recent Results (from the past 2160 hour(s))  Cytology - PAP     Status: None   Collection Time: 01/04/17 12:00 AM  Result Value Ref Range   Adequacy      Satisfactory for evaluation  endocervical/transformation zone component PRESENT.   Diagnosis      NEGATIVE FOR INTRAEPITHELIAL LESIONS OR MALIGNANCY.   HPV NOT DETECTED     Comment: Normal Reference Range - NOT Detected   Chlamydia Negative     Comment: Normal Reference Range - Negative   Neisseria gonorrhea Negative     Comment: Normal Reference Range - Negative   Material Submitted CervicoVaginal Pap [ThinPrep Imaged]   Urine Culture     Status: None   Collection Time: 01/04/17  3:00 PM  Result Value Ref Range   Urine Culture, Routine Final report    Organism ID, Bacteria No growth     Assessment/Plan: Acute bacterial sinusitis Rx Augmentin.  Increase fluids.  Rest.  Saline nasal spray.  Probiotic.  Mucinex as directed.  Humidifier in bedroom. Patient to restart her Flonase and Claritin.  Call or return to clinic if symptoms are not improving.   History of iron deficiency anemia Repeat labs today. Will restart treatment if indicated by results.  GAD (generalized anxiety disorder) Discussed treatment options. Handout on [facility] counseling services given. She is going to call and set up appointment. Will start Sertraline 25 mg daily. Follow-up scheduled. Alarm signs/symptoms reviewed that would prompt immediate return or ER assessment. Patient voiced understanding and agreement with the plan.    Piedad ClimesWilliam  Cody Duayne Brideau, PA-C

## 2017-02-28 LAB — IRON,TIBC AND FERRITIN PANEL
%SAT: 71 % — AB (ref 11–50)
Ferritin: 18 ng/mL (ref 10–154)
IRON: 270 ug/dL — AB (ref 40–190)
TIBC: 379 ug/dL (ref 250–450)

## 2017-03-02 ENCOUNTER — Telehealth: Payer: Self-pay | Admitting: Physician Assistant

## 2017-03-02 NOTE — Telephone Encounter (Signed)
Copied from CRM [id number]. Topic: Quick Communication - Lab Results >> Mar 02, 2017  9:39 AM Con MemosMoore, Patina S, CMA wrote: Called patient to inform them of lab results. When patient returns call, triage nurse may disclose results. >> Mar 02, 2017 10:00 AM Cipriano BunkerLambe, Annette S wrote: Called back for Lab results. Shows nurse triage can give results. Please call back

## 2017-03-08 ENCOUNTER — Encounter: Payer: Self-pay | Admitting: Emergency Medicine

## 2017-03-22 ENCOUNTER — Telehealth: Payer: Self-pay | Admitting: Physician Assistant

## 2017-03-22 NOTE — Telephone Encounter (Signed)
Called to schedule. LMOVM. CRM entered.

## 2017-03-22 NOTE — Telephone Encounter (Signed)
Copied from CRM [phone]. Topic: Quick Communication - See Telephone Encounter >> Mar 22, 2017  9:15 AM Janet Hall, Janet Hall wrote: CRM for notification. See Telephone encounter for:  Seen on 12/4 and given an antibiotic pt is still having pressure to the touch on forehead and a little pressure in ear still. Pt wanting to know if something can be called in or if she needs to be seen again.  03/22/17.

## 2017-04-04 DIAGNOSIS — R51 Headache: Secondary | ICD-10-CM | POA: Diagnosis not present

## 2017-04-04 DIAGNOSIS — S335XXA Sprain of ligaments of lumbar spine, initial encounter: Secondary | ICD-10-CM | POA: Diagnosis not present

## 2017-04-04 DIAGNOSIS — S134XXA Sprain of ligaments of cervical spine, initial encounter: Secondary | ICD-10-CM | POA: Diagnosis not present

## 2017-04-13 ENCOUNTER — Other Ambulatory Visit: Payer: Self-pay

## 2017-04-13 ENCOUNTER — Ambulatory Visit: Payer: BLUE CROSS/BLUE SHIELD | Admitting: Physician Assistant

## 2017-04-13 ENCOUNTER — Encounter: Payer: Self-pay | Admitting: Physician Assistant

## 2017-04-13 VITALS — BP 120/76 | HR 110 | Temp 98.7°F | Resp 14 | Ht 70.0 in | Wt 128.0 lb

## 2017-04-13 DIAGNOSIS — J019 Acute sinusitis, unspecified: Secondary | ICD-10-CM | POA: Diagnosis not present

## 2017-04-13 DIAGNOSIS — B9689 Other specified bacterial agents as the cause of diseases classified elsewhere: Secondary | ICD-10-CM

## 2017-04-13 MED ORDER — PREDNISONE 10 MG PO TABS: ORAL_TABLET | ORAL | 0 refills | Status: DC

## 2017-04-13 MED ORDER — AMOXICILLIN-POT CLAVULANATE 875-125 MG PO TABS: 1.0000 | ORAL_TABLET | Freq: Two times a day (BID) | ORAL | 0 refills | Status: DC

## 2017-04-13 NOTE — Progress Notes (Signed)
Patient presents to clinic today c/o sinus pressure/sinus pain, fatigue, ear pressure and balance issues. Denies chest pain or SOB. Notes cough and chills. Symptoms are continuing to worsen.   Past Medical History:  Diagnosis Date  . Asthma, mild persistent   . Family history of breast cancer in mother    Per oncology, pt should start mammograms at age 32.  Marland Kitchen GAD (generalized anxiety disorder) 11/21/2011  . Migraine without aura    Onset in early teens.  Anywhere from 2 per week to 1 per month.      Current Outpatient Medications on File Prior to Visit  Medication Sig Dispense Refill  . albuterol (VENTOLIN HFA) 108 (90 Base) MCG/ACT inhaler Inhale 2 puffs every 4 (four) hours as needed into the lungs for wheezing. 18 g 5  . loratadine (CLARITIN) 10 MG tablet Take 10 mg by mouth daily as needed for allergies.     Marland Kitchen sertraline (ZOLOFT) 25 MG tablet Take 1 tablet (25 mg total) by mouth daily. 30 tablet 3  . fluticasone (FLONASE) 50 MCG/ACT nasal spray Place 2 sprays into both nostrils daily. (Patient not taking: Reported on 04/13/2017) 16 g 6   No current facility-administered medications on file prior to visit.     No Known Allergies  Family History  Problem Relation Age of Onset  . Arthritis Mother   . Cancer Mother 38       breast.  Died of metastatic breast cancer 2017.  Marland Kitchen Hypertension Father   . Arthritis Maternal Grandmother        liver cancer  . Arthritis Maternal Grandfather        stomach cancer  . Diabetes Paternal Grandmother   . Heart attack Paternal Grandmother   . Other Paternal Grandfather        vertigo    Social History   Socioeconomic History  . Marital status: Married    Spouse name: None  . Number of children: None  . Years of education: None  . Highest education level: None  Social Needs  . Financial resource strain: None  . Food insecurity - worry: None  . Food insecurity - inability: None  . Transportation needs - medical: None  .  Transportation needs - non-medical: None  Occupational History  . None  Tobacco Use  . Smoking status: Never Smoker  . Smokeless tobacco: Never Used  Substance and Sexual Activity  . Alcohol use: No  . Drug use: No  . Sexual activity: Yes    Birth control/protection: None  Other Topics Concern  . None  Social History Narrative   Married, 1 child.   Educ: HS at [employer].   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   No T/A/Ds.   Intermittent exercising.   Her mom passed away from metastatic breast cancer, summer 2017.   Review of Systems - See HPI.  All other ROS are negative.  BP 120/76   Pulse (!) 110   Temp 98.7 F (37.1 C) (Oral)   Resp 14   Ht 5\' 10"  (1.778 m)   Wt 128 lb (58.1 kg)   SpO2 99%   BMI 18.37 kg/m   Physical Exam  Constitutional: She is oriented to person, place, and time and well-developed, well-nourished, and in no distress.  HENT:  Head: Normocephalic and atraumatic.  Right Ear: A middle ear effusion is present.  Left Ear: A middle ear effusion is present.  Nose: Rhinorrhea present. Right sinus exhibits frontal sinus tenderness.  Left sinus exhibits frontal sinus tenderness.  Mouth/Throat: Uvula is midline, oropharynx is clear and moist and mucous membranes are normal.  Eyes: Conjunctivae are normal.  Neck: Neck supple.  Cardiovascular: Normal rate, regular rhythm, normal heart sounds and intact distal pulses.  Pulmonary/Chest: Effort normal and breath sounds normal. No respiratory distress. She has no wheezes. She has no rales. She exhibits no tenderness.  Neurological: She is alert and oriented to person, place, and time.  Skin: Skin is warm and dry. No rash noted.  Psychiatric: Affect normal.  Vitals reviewed.  Recent Results (from the past 2160 hour(s))  Iron, TIBC and Ferritin Panel     Status: Abnormal   Collection Time: 02/27/17  2:37 PM  Result Value Ref Range   Iron 270 (H) 40 - 190 mcg/dL   TIBC 098379 119250 - 147450 mcg/dL (calc)     %SAT 71 (H) 11 - 50 % (calc)   Ferritin 18 10 - 154 ng/mL    Assessment/Plan: 1. Acute bacterial sinusitis Rx Augmentin.  Increase fluids.  Rest.  Saline nasal spray.  Probiotic.  Mucinex as directed.  Humidifier in bedroom. Prednisone taper for eustachian tube dysfunction.  Call or return to clinic if symptoms are not improving.  - amoxicillin-clavulanate (AUGMENTIN) 875-125 MG tablet; Take 1 tablet by mouth 2 (two) times daily.  Dispense: 14 tablet; Refill: 0 - predniSONE (DELTASONE) 10 MG tablet; Take 3 tablets daily (30 mg) x 3 days, then 2 tablets daily x 3 days, then 1 tablet daily x 3 days.  Dispense: 18 tablet; Refill: 0   Piedad ClimesWilliam Cody Terree Gaultney, PA-C

## 2017-04-13 NOTE — Patient Instructions (Signed)
Please take antibiotic as directed.  Increase fluid intake.  Use Saline nasal spray.  Take a daily multivitamin. Use Prednisone as directed.  Place a humidifier in the bedroom.  Please call or return clinic if symptoms are not improving.  Sinusitis Sinusitis is redness, soreness, and swelling (inflammation) of the paranasal sinuses. Paranasal sinuses are air pockets within the bones of your face (beneath the eyes, the middle of the forehead, or above the eyes). In healthy paranasal sinuses, mucus is able to drain out, and air is able to circulate through them by way of your nose. However, when your paranasal sinuses are inflamed, mucus and air can become trapped. This can allow bacteria and other germs to grow and cause infection. Sinusitis can develop quickly and last only a short time (acute) or continue over a long period (chronic). Sinusitis that lasts for more than 12 weeks is considered chronic.  CAUSES  Causes of sinusitis include:  Allergies.  Structural abnormalities, such as displacement of the cartilage that separates your nostrils (deviated septum), which can decrease the air flow through your nose and sinuses and affect sinus drainage.  Functional abnormalities, such as when the small hairs (cilia) that line your sinuses and help remove mucus do not work properly or are not present. SYMPTOMS  Symptoms of acute and chronic sinusitis are the same. The primary symptoms are pain and pressure around the affected sinuses. Other symptoms include:  Upper toothache.  Earache.  Headache.  Bad breath.  Decreased sense of smell and taste.  A cough, which worsens when you are lying flat.  Fatigue.  Fever.  Thick drainage from your nose, which often is green and may contain pus (purulent).  Swelling and warmth over the affected sinuses. DIAGNOSIS  Your caregiver will perform a physical exam. During the exam, your caregiver may:  Look in your nose for signs of abnormal growths in  your nostrils (nasal polyps).  Tap over the affected sinus to check for signs of infection.  View the inside of your sinuses (endoscopy) with a special imaging device with a light attached (endoscope), which is inserted into your sinuses. If your caregiver suspects that you have chronic sinusitis, one or more of the following tests may be recommended:  Allergy tests.  Nasal culture A sample of mucus is taken from your nose and sent to a lab and screened for bacteria.  Nasal cytology A sample of mucus is taken from your nose and examined by your caregiver to determine if your sinusitis is related to an allergy. TREATMENT  Most cases of acute sinusitis are related to a viral infection and will resolve on their own within 10 days. Sometimes medicines are prescribed to help relieve symptoms (pain medicine, decongestants, nasal steroid sprays, or saline sprays).  However, for sinusitis related to a bacterial infection, your caregiver will prescribe antibiotic medicines. These are medicines that will help kill the bacteria causing the infection.  Rarely, sinusitis is caused by a fungal infection. In theses cases, your caregiver will prescribe antifungal medicine. For some cases of chronic sinusitis, surgery is needed. Generally, these are cases in which sinusitis recurs more than 3 times per year, despite other treatments. HOME CARE INSTRUCTIONS   Drink plenty of water. Water helps thin the mucus so your sinuses can drain more easily.  Use a humidifier.  Inhale steam 3 to 4 times a day (for example, sit in the bathroom with the shower running).  Apply a warm, moist washcloth to your face 3 to  4 times a day, or as directed by your caregiver.  Use saline nasal sprays to help moisten and clean your sinuses.  Take over-the-counter or prescription medicines for pain, discomfort, or fever only as directed by your caregiver. SEEK IMMEDIATE MEDICAL CARE IF:  You have increasing pain or severe  headaches.  You have nausea, vomiting, or drowsiness.  You have swelling around your face.  You have vision problems.  You have a stiff neck.  You have difficulty breathing. MAKE SURE YOU:   Understand these instructions.  Will watch your condition.  Will get help right away if you are not doing well or get worse. Document Released: 03/13/2005 Document Revised: 06/05/2011 Document Reviewed: 03/28/2011 [facility] Patient Information 2014 [place], [place].

## 2017-04-18 ENCOUNTER — Telehealth: Payer: Self-pay | Admitting: Physician Assistant

## 2017-04-18 MED ORDER — LEVOCETIRIZINE DIHYDROCHLORIDE 5 MG PO TABS: 5.0000 mg | ORAL_TABLET | Freq: Every evening | ORAL | 0 refills | Status: DC

## 2017-04-18 NOTE — Telephone Encounter (Signed)
Copied from CRM [phone]. Topic: General - Other >> Apr 18, 2017 11:25 AM Lelon FrohlichGolden, Demerius Podolak, RMA wrote: Reason for CRM: pt has questions about OTC medications that was suggested by the doctor and also that she is still having some ear pain Please contact pt at [phone]

## 2017-04-18 NOTE — Telephone Encounter (Signed)
Spoke with patient about OTC medications. She was confused about taking Xyzal with Claritin. Advised to stop the Claritin and just take the Xyzal. Finish the abx and prednisone. Let us know if symptoms are not improving after finishing medications. She is agreeable

## 2017-04-19 ENCOUNTER — Telehealth: Payer: Self-pay | Admitting: Physician Assistant

## 2017-04-19 NOTE — Telephone Encounter (Signed)
Copied from CRM [phone]. Topic: Quick Communication - See Telephone Encounter >> Apr 19, 2017  4:54 PM Terisa Starraylor, Brittany L wrote: CRM for notification. See Telephone encounter for:   04/19/17.   Patient said the amoxicillin-clavulanate (AUGMENTIN) 875-125 MG tablet is not working. She wants to know if something else can be called in for it. Her ears still hurt. Call back is [phone]

## 2017-04-20 ENCOUNTER — Encounter: Payer: Self-pay | Admitting: Physician Assistant

## 2017-04-20 ENCOUNTER — Other Ambulatory Visit: Payer: Self-pay

## 2017-04-20 ENCOUNTER — Ambulatory Visit: Payer: BLUE CROSS/BLUE SHIELD | Admitting: Physician Assistant

## 2017-04-20 VITALS — BP 110/78 | HR 78 | Temp 98.3°F | Resp 14 | Ht 70.0 in | Wt 128.0 lb

## 2017-04-20 DIAGNOSIS — H6983 Other specified disorders of Eustachian tube, bilateral: Secondary | ICD-10-CM

## 2017-04-20 NOTE — Patient Instructions (Signed)
Please finish entire course of antibiotic.  Keep up with saline nasal rinses. Finish the steroid taper.  Due to ear pain, I want you to stop Claritin or Xyzal. Start an OTC Zyrtec-D to help dry up this fluid behind the ear further and alleviate pressure/pain.  If not improving, we will need assessment with ENT.

## 2017-04-20 NOTE — Progress Notes (Signed)
Patient presents to clinic today c/o continue ear pressure despite use of antibiotic, prednisone and antihistamine.  Sinus symptoms are resolving with antibiotic. Is just having continued and slightly worsening ear pressure and ear pain.  Past Medical History:  Diagnosis Date  . Asthma, mild persistent   . Family history of breast cancer in mother    Per oncology, pt should start mammograms at age 32.  Janet Hall. GAD (generalized anxiety disorder) 11/21/2011  . Migraine without aura    Onset in early teens.  Anywhere from 2 per week to 1 per month.      Current Outpatient Medications on File Prior to Visit  Medication Sig Dispense Refill  . albuterol (VENTOLIN HFA) 108 (90 Base) MCG/ACT inhaler Inhale 2 puffs every 4 (four) hours as needed into the lungs for wheezing. 18 g 5  . fluticasone (FLONASE) 50 MCG/ACT nasal spray Place 2 sprays into both nostrils daily. 16 g 6  . levocetirizine (XYZAL) 5 MG tablet Take 1 tablet (5 mg total) by mouth every evening. 30 tablet 0  . sertraline (ZOLOFT) 25 MG tablet Take 1 tablet (25 mg total) by mouth daily. 30 tablet 3  . amoxicillin-clavulanate (AUGMENTIN) 875-125 MG tablet Take 1 tablet by mouth 2 (two) times daily. (Patient not taking: Reported on 04/20/2017) 14 tablet 0  . loratadine (CLARITIN) 10 MG tablet Take 10 mg by mouth daily as needed for allergies.     . predniSONE (DELTASONE) 10 MG tablet Take 3 tablets daily (30 mg) x 3 days, then 2 tablets daily x 3 days, then 1 tablet daily x 3 days. (Patient not taking: Reported on 04/20/2017) 18 tablet 0   No current facility-administered medications on file prior to visit.     No Known Allergies  Family History  Problem Relation Age of Onset  . Arthritis Mother   . Cancer Mother [id number]       breast.  Died of metastatic breast cancer 2017.  Janet Hall. Hypertension Father   . Arthritis Maternal Grandmother        liver cancer  . Arthritis Maternal Grandfather        stomach cancer  . Diabetes Paternal  Grandmother   . Heart attack Paternal Grandmother   . Other Paternal Grandfather        vertigo    Social History   Socioeconomic History  . Marital status: Married    Spouse name: None  . Number of children: None  . Years of education: None  . Highest education level: None  Social Needs  . Financial resource strain: None  . Food insecurity - worry: None  . Food insecurity - inability: None  . Transportation needs - medical: None  . Transportation needs - non-medical: None  Occupational History  . None  Tobacco Use  . Smoking status: Never Smoker  . Smokeless tobacco: Never Used  Substance and Sexual Activity  . Alcohol use: No  . Drug use: No  . Sexual activity: Yes    Birth control/protection: None  Other Topics Concern  . None  Social History Narrative   Married, 1 child.   Educ[place] [employer].   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   No T/A/Ds.   Intermittent exercising.   Her mom passed away from metastatic breast cancer, summer 2017.   Review of Systems - See HPI.  All other ROS are negative.  BP 110/78   Pulse 78   Temp 98.3 F (36.8 C) (Oral)  Resp 14   Ht 5\' 10"  (1.778 m)   Wt 128 lb (58.1 kg)   SpO2 100%   BMI 18.37 kg/m   Physical Exam  Constitutional: She is oriented to person, place, and time and well-developed, well-nourished, and in no distress.  HENT:  Head: Normocephalic and atraumatic.  Right Ear: Tympanic membrane is retracted.  Left Ear: Tympanic membrane is retracted.  Nose: Nose normal. No mucosal edema or rhinorrhea.  Mouth/Throat: Uvula is midline, oropharynx is clear and moist and mucous membranes are normal.  Eyes: Conjunctivae are normal.  Neck: Neck supple.  Cardiovascular: Normal rate, regular rhythm, normal heart sounds and intact distal pulses.  Pulmonary/Chest: Effort normal and breath sounds normal. No respiratory distress. She has no wheezes. She has no rales. She exhibits no tenderness.  Neurological:  She is alert and oriented to person, place, and time.  Skin: Skin is warm and dry. No rash noted.  Vitals reviewed.  Recent Results (from the past 2160 hour(s))  Iron, TIBC and Ferritin Panel     Status: Abnormal   Collection Time: 02/27/17  2:37 PM  Result Value Ref Range   Iron 270 (H) 40 - 190 mcg/dL   TIBC 161 096 - 045 mcg/dL (calc)   %SAT 71 (H) 11 - 50 % (calc)   Ferritin 18 10 - 154 ng/mL    Assessment/Plan: 1. Eustachian tube dysfunction, bilateral Continue Flonase. Finish steroid and antibiotic for sinusitis that is resolving. Start Zyrtec-D instead. ENT if not improving.   Piedad Climes, PA-C

## 2017-04-20 NOTE — Telephone Encounter (Signed)
Patient in office for an appointment today.

## 2017-05-10 DIAGNOSIS — S335XXA Sprain of ligaments of lumbar spine, initial encounter: Secondary | ICD-10-CM | POA: Diagnosis not present

## 2017-05-10 DIAGNOSIS — S134XXA Sprain of ligaments of cervical spine, initial encounter: Secondary | ICD-10-CM | POA: Diagnosis not present

## 2017-05-10 DIAGNOSIS — R51 Headache: Secondary | ICD-10-CM | POA: Diagnosis not present

## 2017-06-04 ENCOUNTER — Encounter: Payer: Self-pay | Admitting: Physician Assistant

## 2017-06-04 ENCOUNTER — Other Ambulatory Visit: Payer: Self-pay

## 2017-06-04 ENCOUNTER — Ambulatory Visit: Payer: BLUE CROSS/BLUE SHIELD | Admitting: Physician Assistant

## 2017-06-04 VITALS — BP 110/78 | HR 90 | Temp 98.6°F | Resp 14 | Ht 70.0 in | Wt 128.0 lb

## 2017-06-04 DIAGNOSIS — N3000 Acute cystitis without hematuria: Secondary | ICD-10-CM | POA: Diagnosis not present

## 2017-06-04 DIAGNOSIS — R399 Unspecified symptoms and signs involving the genitourinary system: Secondary | ICD-10-CM | POA: Diagnosis not present

## 2017-06-04 LAB — POCT URINALYSIS DIPSTICK
BILIRUBIN UA: NEGATIVE
GLUCOSE UA: NEGATIVE
KETONES UA: NEGATIVE
Nitrite, UA: POSITIVE
RBC UA: NEGATIVE
Spec Grav, UA: 1.02 (ref 1.010–1.025)
Urobilinogen, UA: 1 E.U./dL
pH, UA: 6 (ref 5.0–8.0)

## 2017-06-04 MED ORDER — CEPHALEXIN 500 MG PO CAPS: 500.0000 mg | ORAL_CAPSULE | Freq: Two times a day (BID) | ORAL | 0 refills | Status: AC

## 2017-06-04 NOTE — Patient Instructions (Signed)
Your symptoms are consistent with a bladder infection, also called acute cystitis. Please take your antibiotic (Keflex) as directed until all pills are gone.  Stay very well hydrated.  Consider a daily probiotic (Align, Culturelle, or Activia) to help prevent stomach upset caused by the antibiotic.  Taking a probiotic daily may also help prevent recurrent UTIs.  Also consider taking AZO (Phenazopyridine) tablets to help decrease pain with urination.  I will call you with your urine testing results.  We will change antibiotics if indicated.  Call or return to clinic if symptoms are not resolved by completion of antibiotic.   Urinary Tract Infection A urinary tract infection (UTI) can occur any place along the urinary tract. The tract includes the kidneys, ureters, bladder, and urethra. A type of germ called bacteria often causes a UTI. UTIs are often helped with antibiotic medicine.  HOME CARE   If given, take antibiotics as told by your doctor. Finish them even if you start to feel better.  Drink enough fluids to keep your pee (urine) clear or pale yellow.  Avoid tea, drinks with caffeine, and bubbly (carbonated) drinks.  Pee often. Avoid holding your pee in for a long time.  Pee before and after having sex (intercourse).  Wipe from front to back after you poop (bowel movement) if you are a woman. Use each tissue only once. GET HELP RIGHT AWAY IF:   You have back pain.  You have lower belly (abdominal) pain.  You have chills.  You feel sick to your stomach (nauseous).  You throw up (vomit).  Your burning or discomfort with peeing does not go away.  You have a fever.  Your symptoms are not better in 3 days. MAKE SURE YOU:   Understand these instructions.  Will watch your condition.  Will get help right away if you are not doing well or get worse. Document Released: 08/30/2007 Document Revised: 12/06/2011 Document Reviewed: 10/12/2011 [employer] Patient Information 2015  [employer]. This information is not intended to replace advice given to you by your health care provider. Make sure you discuss any questions you have with your health care provider.   

## 2017-06-04 NOTE — Progress Notes (Signed)
Patient presents to clinic today c/o 1 day of dysuria, hematuria, urinary urgency and frequency. Denies fever, chills, nausea/vomiting, abdominal pain or flank pain. Has been hydrating. Is taking AZO. Denies concern for STI.   Past Medical History:  Diagnosis Date  . Asthma, mild persistent   . Family history of breast cancer in mother    Per oncology, pt should start mammograms at age 32.  Marland Kitchen. GAD (generalized anxiety disorder) 11/21/2011  . Migraine without aura    Onset in early teens.  Anywhere from 2 per week to 1 per month.      Current Outpatient Medications on File Prior to Visit  Medication Sig Dispense Refill  . albuterol (VENTOLIN HFA) 108 (90 Base) MCG/ACT inhaler Inhale 2 puffs every 4 (four) hours as needed into the lungs for wheezing. 18 g 5  . fluticasone (FLONASE) 50 MCG/ACT nasal spray Place 2 sprays into both nostrils daily. 16 g 6  . levocetirizine (XYZAL) 5 MG tablet Take 1 tablet (5 mg total) by mouth every evening. 30 tablet 0  . sertraline (ZOLOFT) 25 MG tablet Take 1 tablet (25 mg total) by mouth daily. 30 tablet 3   No current facility-administered medications on file prior to visit.     No Known Allergies  Family History  Problem Relation Age of Onset  . Arthritis Mother   . Cancer Mother [id number]       breast.  Died of metastatic breast cancer 2017.  Marland Kitchen. Hypertension Father   . Arthritis Maternal Grandmother        liver cancer  . Arthritis Maternal Grandfather        stomach cancer  . Diabetes Paternal Grandmother   . Heart attack Paternal Grandmother   . Other Paternal Grandfather        vertigo    Social History   Socioeconomic History  . Marital status: Married    Spouse name: None  . Number of children: None  . Years of education: None  . Highest education level: None  Social Needs  . Financial resource strain: None  . Food insecurity - worry: None  . Food insecurity - inability: None  . Transportation needs - medical: None  .  Transportation needs - non-medical: None  Occupational History  . None  Tobacco Use  . Smoking status: Never Smoker  . Smokeless tobacco: Never Used  Substance and Sexual Activity  . Alcohol use: No  . Drug use: No  . Sexual activity: Yes    Birth control/protection: None  Other Topics Concern  . None  Social History Narrative   Married, 1 child.   Educ: HS at [employer].   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   No T/A/Ds.   Intermittent exercising.   Her mom passed away from metastatic breast cancer, summer 2017.   Review of Systems - See HPI.  All other ROS are negative.  BP 110/78   Pulse 90   Temp 98.6 F (37 C) (Oral)   Resp 14   Ht 5\' 10"  (1.778 m)   Wt 128 lb (58.1 kg)   SpO2 100%   BMI 18.37 kg/m   Physical Exam  Constitutional: She is oriented to person, place, and time and well-developed, well-nourished, and in no distress.  HENT:  Head: Normocephalic and atraumatic.  Eyes: Conjunctivae are normal.  Neck: Neck supple.  Cardiovascular: Normal rate, regular rhythm, normal heart sounds and intact distal pulses.  Pulmonary/Chest: Effort normal and breath sounds normal.  No respiratory distress. She has no wheezes. She has no rales. She exhibits no tenderness.  Abdominal: Soft. Bowel sounds are normal.  Negative CVA tenderness.   Neurological: She is alert and oriented to person, place, and time.  Skin: Skin is warm and dry. No rash noted.  Psychiatric: Affect normal.  Vitals reviewed.  Recent Results (from the past 2160 hour(s))  POCT Urinalysis Dipstick     Status: Abnormal   Collection Time: 06/04/17  1:12 PM  Result Value Ref Range   Color, UA Dark Amber    Clarity, UA Cloudy    Glucose, UA negative    Bilirubin, UA negative    Ketones, UA negative    Spec Grav, UA 1.020 1.010 - 1.025   Blood, UA negative    pH, UA 6.0 5.0 - 8.0   Protein, UA trace    Urobilinogen, UA 1.0 0.2 or 1.0 E.U./dL   Nitrite, UA positive    Leukocytes, UA  Small (1+) (A) Negative   Appearance     Odor     Assessment/Plan: 1. Acute cystitis without hematuria Urine dip + LE and nitrites. Will send for culture. Rx Keflex BID x 7 days. Increase hydration. Supportive measures discussed. Will alter regimen if indicated by culture results.  - POCT Urinalysis Dipstick - Urine Culture   Piedad Climes, PA-C

## 2017-06-06 LAB — URINE CULTURE
MICRO NUMBER:: 90307412
SPECIMEN QUALITY:: ADEQUATE

## 2017-06-11 ENCOUNTER — Other Ambulatory Visit: Payer: Self-pay

## 2017-06-11 ENCOUNTER — Ambulatory Visit: Payer: BLUE CROSS/BLUE SHIELD | Admitting: Physician Assistant

## 2017-06-11 ENCOUNTER — Encounter: Payer: Self-pay | Admitting: Physician Assistant

## 2017-06-11 VITALS — BP 114/76 | HR 136 | Temp 98.5°F | Resp 18 | Ht 70.0 in | Wt 127.8 lb

## 2017-06-11 DIAGNOSIS — F411 Generalized anxiety disorder: Secondary | ICD-10-CM | POA: Diagnosis not present

## 2017-06-11 DIAGNOSIS — J069 Acute upper respiratory infection, unspecified: Secondary | ICD-10-CM

## 2017-06-11 LAB — POC INFLUENZA A&B (BINAX/QUICKVUE): Influenza A, POC: NEGATIVE

## 2017-06-11 NOTE — Progress Notes (Signed)
Patient presents to clinic today c/o 3.5 days of ear pressure, nasal drip, sore throat and mild cough. Denies fever, chills. Notes ear pain that resolved with use of Sudafed. Is also taking allergy medications as directed. Wants assessment for the flu today due to sick contacts at work and within the family.   Past Medical History:  Diagnosis Date  . Asthma, mild persistent   . Family history of breast cancer in mother    Per oncology, pt should start mammograms at age 6.  Marland Kitchen GAD (generalized anxiety disorder) 11/21/2011  . Migraine without aura    Onset in early teens.  Anywhere from 2 per week to 1 per month.      Current Outpatient Medications on File Prior to Visit  Medication Sig Dispense Refill  . albuterol (VENTOLIN HFA) 108 (90 Base) MCG/ACT inhaler Inhale 2 puffs every 4 (four) hours as needed into the lungs for wheezing. 18 g 5  . cephALEXin (KEFLEX) 500 MG capsule Take 1 capsule (500 mg total) by mouth 2 (two) times daily for 7 days. 14 capsule 0  . fluticasone (FLONASE) 50 MCG/ACT nasal spray Place 2 sprays into both nostrils daily. (Patient taking differently: Place 2 sprays into both nostrils as needed. ) 16 g 6  . levocetirizine (XYZAL) 5 MG tablet Take 1 tablet (5 mg total) by mouth every evening. 30 tablet 0  . sertraline (ZOLOFT) 25 MG tablet Take 1 tablet (25 mg total) by mouth daily. 30 tablet 3   No current facility-administered medications on file prior to visit.     No Known Allergies  Family History  Problem Relation Age of Onset  . Arthritis Mother   . Cancer Mother 39       breast.  Died of metastatic breast cancer 2017.  Marland Kitchen Hypertension Father   . Arthritis Maternal Grandmother        liver cancer  . Arthritis Maternal Grandfather        stomach cancer  . Diabetes Paternal Grandmother   . Heart attack Paternal Grandmother   . Other Paternal Grandfather        vertigo    Social History   Socioeconomic History  . Marital status: Married   Spouse name: None  . Number of children: None  . Years of education: None  . Highest education level: None  Social Needs  . Financial resource strain: None  . Food insecurity - worry: None  . Food insecurity - inability: None  . Transportation needs - medical: None  . Transportation needs - non-medical: None  Occupational History  . None  Tobacco Use  . Smoking status: Never Smoker  . Smokeless tobacco: Never Used  Substance and Sexual Activity  . Alcohol use: No  . Drug use: No  . Sexual activity: Yes    Birth control/protection: None  Other Topics Concern  . None  Social History Narrative   Married, 1 child.   Educ: HS at [employer].   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   No T/A/Ds.   Intermittent exercising.   Her mom passed away from metastatic breast cancer, summer 2017.   Review of Systems - See HPI.  All other ROS are negative.  BP 114/76   Pulse (!) 136   Temp 98.5 F (36.9 C) (Oral)   Resp 18   Ht 5\' 10"  (1.778 m)   Wt 127 lb 12.8 oz (58 kg)   LMP 05/20/2017   SpO2 99%  BMI 18.34 kg/m   Physical Exam  Constitutional: She is oriented to person, place, and time and well-developed, well-nourished, and in no distress.  HENT:  Head: Normocephalic and atraumatic.  Cardiovascular: Normal rate, regular rhythm, normal heart sounds and intact distal pulses.  Pulmonary/Chest: Effort normal and breath sounds normal. No respiratory distress. She has no wheezes. She has no rales. She exhibits no tenderness.  Neurological: She is alert and oriented to person, place, and time.  Skin: Skin is warm and dry. No rash noted.  Vitals reviewed.  Recent Results (from the past 2160 hour(s))  POCT Urinalysis Dipstick     Status: Abnormal   Collection Time: 06/04/17  1:12 PM  Result Value Ref Range   Color, UA Dark Amber    Clarity, UA Cloudy    Glucose, UA negative    Bilirubin, UA negative    Ketones, UA negative    Spec Grav, UA 1.020 1.010 - 1.025    Blood, UA negative    pH, UA 6.0 5.0 - 8.0   Protein, UA trace    Urobilinogen, UA 1.0 0.2 or 1.0 E.U./dL   Nitrite, UA positive    Leukocytes, UA Small (1+) (A) Negative   Appearance     Odor    Urine Culture     Status: Abnormal   Collection Time: 06/04/17  1:23 PM  Result Value Ref Range   MICRO NUMBER: [id number]    SPECIMEN QUALITY: ADEQUATE    Sample Source NOT GIVEN    STATUS: FINAL    ISOLATE 1: Escherichia coli (A)     Comment: Greater than 100,000 CFU/mL of Escherichia coli      Susceptibility   Escherichia coli - URINE CULTURE, REFLEX    AMOX/CLAVULANIC <=2 Sensitive     AMPICILLIN <=2 Sensitive     AMPICILLIN/SULBACTAM <=2 Sensitive     CEFAZOLIN* <=4 Not Reportable      * For infections other than uncomplicated UTIcaused by E. coli, K. pneumoniae or P. mirabilis:Cefazolin is resistant if MIC > or = 8 mcg/mL.(Distinguishing susceptible versus intermediatefor isolates with MIC < or = 4 mcg/mL requiresadditional testing.)For uncomplicated UTI caused by E. coli,K. pneumoniae or P. mirabilis: Cefazolin issusceptible if MIC <32 mcg/mL and predictssusceptible to the oral agents cefaclor, cefdinir,cefpodoxime, cefprozil, cefuroxime, cephalexinand loracarbef.    CEFEPIME <=1 Sensitive     CEFTRIAXONE <=1 Sensitive     CIPROFLOXACIN <=0.25 Sensitive     LEVOFLOXACIN <=0.12 Sensitive     ERTAPENEM <=0.5 Sensitive     GENTAMICIN <=1 Sensitive     IMIPENEM <=0.25 Sensitive     NITROFURANTOIN <=16 Sensitive     PIP/TAZO <=4 Sensitive     TOBRAMYCIN <=1 Sensitive     TRIMETH/SULFA* <=20 Sensitive      * For infections other than uncomplicated UTIcaused by E. coli, K. pneumoniae or P. mirabilis:Cefazolin is resistant if MIC > or = 8 mcg/mL.(Distinguishing susceptible versus intermediatefor isolates with MIC < or = 4 mcg/mL requiresadditional testing.)For uncomplicated UTI caused by E. coli,K. pneumoniae or P. mirabilis: Cefazolin issusceptible if MIC <32 mcg/mL and predictssusceptible  to the oral agents cefaclor, cefdinir,cefpodoxime, cefprozil, cefuroxime, cephalexinand loracarbef.Legend:S = Susceptible  I = IntermediateR = Resistant  NS = Not susceptible* = Not tested  NR = Not reported**NN = See antimicrobic comments  POC Influenza A&B(BINAX/QUICKVUE)     Status: Normal   Collection Time: 06/11/17  9:46 AM  Result Value Ref Range   Influenza A, POC Negative Negative   Influenza B,  POC  Negative    Assessment/Plan: 1. Viral URI Flu swab negative. Exam unremarkable except for mild erythema and retracted ear drums. Continue allergy medications. Zyrtec-D discussed. Supportive measures and OTC medications reviewed. Follow-up if not improving.  - POC Influenza A&B(BINAX/QUICKVUE)  2. GAD (generalized anxiety disorder) Breakthrough symptoms daily. Denies SI/HI. Increase Sertraline to 50 mg daily. Follow-up 3-4 weeks.    Piedad ClimesWilliam Cody Stashia Sia, PA-C

## 2017-06-11 NOTE — Progress Notes (Deleted)
Patient presents to clinic today c/o ***.   Past Medical History:  Diagnosis Date  . Asthma, mild persistent   . Family history of breast cancer in mother    Per oncology, pt should start mammograms at age 32.  Marland Kitchen. GAD (generalized anxiety disorder) 11/21/2011  . Migraine without aura    Onset in early teens.  Anywhere from 2 per week to 1 per month.      Current Outpatient Medications on File Prior to Visit  Medication Sig Dispense Refill  . albuterol (VENTOLIN HFA) 108 (90 Base) MCG/ACT inhaler Inhale 2 puffs every 4 (four) hours as needed into the lungs for wheezing. 18 g 5  . cephALEXin (KEFLEX) 500 MG capsule Take 1 capsule (500 mg total) by mouth 2 (two) times daily for 7 days. 14 capsule 0  . fluticasone (FLONASE) 50 MCG/ACT nasal spray Place 2 sprays into both nostrils daily. (Patient taking differently: Place 2 sprays into both nostrils as needed. ) 16 g 6  . levocetirizine (XYZAL) 5 MG tablet Take 1 tablet (5 mg total) by mouth every evening. 30 tablet 0  . sertraline (ZOLOFT) 25 MG tablet Take 1 tablet (25 mg total) by mouth daily. 30 tablet 3   No current facility-administered medications on file prior to visit.     No Known Allergies  Family History  Problem Relation Age of Onset  . Arthritis Mother   . Cancer Mother 1757       breast.  Died of metastatic breast cancer 2017.  Marland Kitchen. Hypertension Father   . Arthritis Maternal Grandmother        liver cancer  . Arthritis Maternal Grandfather        stomach cancer  . Diabetes Paternal Grandmother   . Heart attack Paternal Grandmother   . Other Paternal Grandfather        vertigo    Social History   Socioeconomic History  . Marital status: Married    Spouse name: None  . Number of children: None  . Years of education: None  . Highest education level: None  Social Needs  . Financial resource strain: None  . Food insecurity - worry: None  . Food insecurity - inability: None  . Transportation needs - medical: None   . Transportation needs - non-medical: None  Occupational History  . None  Tobacco Use  . Smoking status: Never Smoker  . Smokeless tobacco: Never Used  Substance and Sexual Activity  . Alcohol use: No  . Drug use: No  . Sexual activity: Yes    Birth control/protection: None  Other Topics Concern  . None  Social History Narrative   Married, 1 child.   [place] [employer].   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   No T/A/Ds.   Intermittent exercising.   Her mom passed away from metastatic breast cancer, summer 2017.   Review of Systems - See HPI.  All other ROS are negative.  BP 114/76   Pulse (!) 136   Temp 98.5 F (36.9 C) (Oral)   Resp 18   Ht 5\' 10"  (1.778 m)   Wt 127 lb 12.8 oz (58 kg)   LMP 05/20/2017   SpO2 99%   BMI 18.34 kg/m   Physical Exam  Constitutional: She is oriented to person, place, and time and well-developed, well-nourished, and in no distress.  HENT:  Head: Normocephalic and atraumatic.  Cardiovascular: Normal rate, regular rhythm, normal heart sounds and intact distal pulses.  Pulmonary/Chest: Effort normal and breath sounds normal. No respiratory distress. She has no wheezes. She has no rales. She exhibits no tenderness.  Neurological: She is alert and oriented to person, place, and time.  Skin: Skin is warm and dry. No rash noted.  Vitals reviewed.  Recent Results (from the past 2160 hour(s))  POCT Urinalysis Dipstick     Status: Abnormal   Collection Time: 06/04/17  1:12 PM  Result Value Ref Range   Color, UA Dark Amber    Clarity, UA Cloudy    Glucose, UA negative    Bilirubin, UA negative    Ketones, UA negative    Spec Grav, UA 1.020 1.010 - 1.025   Blood, UA negative    pH, UA 6.0 5.0 - 8.0   Protein, UA trace    Urobilinogen, UA 1.0 0.2 or 1.0 E.U./dL   Nitrite, UA positive    Leukocytes, UA Small (1+) (A) Negative   Appearance     Odor    Urine Culture     Status: Abnormal   Collection Time: 06/04/17  1:23  PM  Result Value Ref Range   MICRO NUMBER: [id number]    SPECIMEN QUALITY: ADEQUATE    Sample Source NOT GIVEN    STATUS: FINAL    ISOLATE 1: Escherichia coli (A)     Comment: Greater than 100,000 CFU/mL of Escherichia coli      Susceptibility   Escherichia coli - URINE CULTURE, REFLEX    AMOX/CLAVULANIC <=2 Sensitive     AMPICILLIN <=2 Sensitive     AMPICILLIN/SULBACTAM <=2 Sensitive     CEFAZOLIN* <=4 Not Reportable      * For infections other than uncomplicated UTIcaused by E. coli, K. pneumoniae or P. mirabilis:Cefazolin is resistant if MIC > or = 8 mcg/mL.(Distinguishing susceptible versus intermediatefor isolates with MIC < or = 4 mcg/mL requiresadditional testing.)For uncomplicated UTI caused by E. coli,K. pneumoniae or P. mirabilis: Cefazolin issusceptible if MIC <32 mcg/mL and predictssusceptible to the oral agents cefaclor, cefdinir,cefpodoxime, cefprozil, cefuroxime, cephalexinand loracarbef.    CEFEPIME <=1 Sensitive     CEFTRIAXONE <=1 Sensitive     CIPROFLOXACIN <=0.25 Sensitive     LEVOFLOXACIN <=0.12 Sensitive     ERTAPENEM <=0.5 Sensitive     GENTAMICIN <=1 Sensitive     IMIPENEM <=0.25 Sensitive     NITROFURANTOIN <=16 Sensitive     PIP/TAZO <=4 Sensitive     TOBRAMYCIN <=1 Sensitive     TRIMETH/SULFA* <=20 Sensitive      * For infections other than uncomplicated UTIcaused by E. coli, K. pneumoniae or P. mirabilis:Cefazolin is resistant if MIC > or = 8 mcg/mL.(Distinguishing susceptible versus intermediatefor isolates with MIC < or = 4 mcg/mL requiresadditional testing.)For uncomplicated UTI caused by E. coli,K. pneumoniae or P. mirabilis: Cefazolin issusceptible if MIC <32 mcg/mL and predictssusceptible to the oral agents cefaclor, cefdinir,cefpodoxime, cefprozil, cefuroxime, cephalexinand loracarbef.Legend:S = Susceptible  I = IntermediateR = Resistant  NS = Not susceptible* = Not tested  NR = Not reported**NN = See antimicrobic comments  POC Influenza A&B(BINAX/QUICKVUE)      Status: Normal   Collection Time: 06/11/17  9:46 AM  Result Value Ref Range   Influenza A, POC Negative Negative   Influenza B, POC  Negative    Assessment/Plan: 1. Viral URI Flu swab negative. Exam unremarkable except for mild erythema and retracted ear drums. Continue allergy medications. Zyrtec-D discussed. Supportive measures and OTC medications reviewed. Follow-up if not improving.  - POC Influenza A&B(BINAX/QUICKVUE)  2.  GAD (generalized anxiety disorder) Breakthrough symptoms daily. Denies SI/HI. Increase Sertraline to 50 mg daily. Follow-up 3-4 weeks.    Piedad Climes, PA-C

## 2017-06-11 NOTE — Patient Instructions (Signed)
Please stay well hydrated and get plenty of rest.  Flu swab was negative. Symptoms point to a viral illness.  This should start to improve over the next few days.  Continue Zyrtec-D and allergy medications. Do not take Sudafed and the Zyrtec-D together.   For the Sertraline, I have sent in a script for 50 mg. Take as directed. You can take two of the 25 mg tablets until you run out of those. Follow-up with me in 3-4 weeks for reassessment of anxiety.   Call me if you note any new or worsening respiratory symptoms. !

## 2017-06-13 ENCOUNTER — Telehealth: Payer: Self-pay | Admitting: Physician Assistant

## 2017-06-13 NOTE — Telephone Encounter (Signed)
Left message on voice mail to call back with question about allergy medications

## 2017-06-13 NOTE — Telephone Encounter (Signed)
Copied from CRM [phone]. Topic: Quick Communication - Rx Refill/Question >> Jun 13, 2017 11:43 AM Alexander BergeronBarksdale, Harvey B wrote: Pt called and states she was told on last OV to take ZYRTEC w/ other allergy medications but was unsure of which current allergy medicine that she takes that she can mix it w/, contact pt to advise

## 2017-06-14 NOTE — Telephone Encounter (Signed)
Patient called, she says she spoke with a pharmacist yesterday and got it all taken care of.

## 2017-06-18 ENCOUNTER — Other Ambulatory Visit: Payer: Self-pay | Admitting: Emergency Medicine

## 2017-06-18 DIAGNOSIS — F411 Generalized anxiety disorder: Secondary | ICD-10-CM

## 2017-06-18 MED ORDER — SERTRALINE HCL 50 MG PO TABS: 50.0000 mg | ORAL_TABLET | Freq: Every day | ORAL | 1 refills | Status: DC

## 2017-06-27 DIAGNOSIS — D2261 Melanocytic nevi of right upper limb, including shoulder: Secondary | ICD-10-CM | POA: Diagnosis not present

## 2017-06-27 DIAGNOSIS — D485 Neoplasm of uncertain behavior of skin: Secondary | ICD-10-CM | POA: Diagnosis not present

## 2017-06-27 DIAGNOSIS — D224 Melanocytic nevi of scalp and neck: Secondary | ICD-10-CM | POA: Diagnosis not present

## 2017-06-27 DIAGNOSIS — D225 Melanocytic nevi of trunk: Secondary | ICD-10-CM | POA: Diagnosis not present

## 2017-06-27 DIAGNOSIS — D2271 Melanocytic nevi of right lower limb, including hip: Secondary | ICD-10-CM | POA: Diagnosis not present

## 2017-06-27 DIAGNOSIS — L918 Other hypertrophic disorders of the skin: Secondary | ICD-10-CM | POA: Diagnosis not present

## 2017-06-27 DIAGNOSIS — D2262 Melanocytic nevi of left upper limb, including shoulder: Secondary | ICD-10-CM | POA: Diagnosis not present

## 2017-07-11 DIAGNOSIS — S335XXA Sprain of ligaments of lumbar spine, initial encounter: Secondary | ICD-10-CM | POA: Diagnosis not present

## 2017-07-11 DIAGNOSIS — R51 Headache: Secondary | ICD-10-CM | POA: Diagnosis not present

## 2017-07-11 DIAGNOSIS — S134XXA Sprain of ligaments of cervical spine, initial encounter: Secondary | ICD-10-CM | POA: Diagnosis not present

## 2017-07-19 ENCOUNTER — Ambulatory Visit: Payer: BLUE CROSS/BLUE SHIELD | Admitting: Physician Assistant

## 2017-07-19 ENCOUNTER — Other Ambulatory Visit: Payer: Self-pay

## 2017-07-19 ENCOUNTER — Encounter: Payer: Self-pay | Admitting: Physician Assistant

## 2017-07-19 VITALS — BP 104/80 | HR 99 | Temp 98.8°F | Resp 14 | Ht 70.0 in | Wt 130.0 lb

## 2017-07-19 DIAGNOSIS — J0110 Acute frontal sinusitis, unspecified: Secondary | ICD-10-CM | POA: Diagnosis not present

## 2017-07-19 DIAGNOSIS — R51 Headache: Secondary | ICD-10-CM | POA: Diagnosis not present

## 2017-07-19 DIAGNOSIS — S134XXA Sprain of ligaments of cervical spine, initial encounter: Secondary | ICD-10-CM | POA: Diagnosis not present

## 2017-07-19 DIAGNOSIS — S335XXA Sprain of ligaments of lumbar spine, initial encounter: Secondary | ICD-10-CM | POA: Diagnosis not present

## 2017-07-19 MED ORDER — AMOXICILLIN-POT CLAVULANATE 875-125 MG PO TABS: 1.0000 | ORAL_TABLET | Freq: Two times a day (BID) | ORAL | 0 refills | Status: DC

## 2017-07-19 NOTE — Progress Notes (Signed)
Patient presents to clinic today c/o > 1 week of sinus pressure, nasal congestion, PND, voice hoarseness and bilateral ear pain with sinus pain. Has history of seasonal allergies. Has been taking Claritin-D, Flonase, Cough drops, cough medications. Denies recent travel or sick contact.   Past Medical History:  Diagnosis Date  . Asthma, mild persistent   . Family history of breast cancer in mother    Per oncology, pt should start mammograms at age 32.  Marland Kitchen GAD (generalized anxiety disorder) 11/21/2011  . Migraine without aura    Onset in early teens.  Anywhere from 2 per week to 1 per month.      Current Outpatient Medications on File Prior to Visit  Medication Sig Dispense Refill  . levocetirizine (XYZAL) 5 MG tablet Take 1 tablet (5 mg total) by mouth every evening. 30 tablet 0  . sertraline (ZOLOFT) 50 MG tablet Take 1 tablet (50 mg total) by mouth daily. 90 tablet 1  . albuterol (VENTOLIN HFA) 108 (90 Base) MCG/ACT inhaler Inhale 2 puffs every 4 (four) hours as needed into the lungs for wheezing. (Patient not taking: Reported on 07/19/2017) 18 g 5  . fluticasone (FLONASE) 50 MCG/ACT nasal spray Place 2 sprays into both nostrils daily. (Patient not taking: Reported on 07/19/2017) 16 g 6   No current facility-administered medications on file prior to visit.     No Known Allergies  Family History  Problem Relation Age of Onset  . Arthritis Mother   . Cancer Mother 65       breast.  Died of metastatic breast cancer 2017.  Marland Kitchen Hypertension Father   . Arthritis Maternal Grandmother        liver cancer  . Arthritis Maternal Grandfather        stomach cancer  . Diabetes Paternal Grandmother   . Heart attack Paternal Grandmother   . Other Paternal Grandfather        vertigo    Social History   Socioeconomic History  . Marital status: Married    Spouse name: Not on file  . Number of children: Not on file  . Years of education: Not on file  . Highest education level: Not on file    Occupational History  . Not on file  Social Needs  . Financial resource strain: Not on file  . Food insecurity:    Worry: Not on file    Inability: Not on file  . Transportation needs:    Medical: Not on file    Non-medical: Not on file  Tobacco Use  . Smoking status: Never Smoker  . Smokeless tobacco: Never Used  Substance and Sexual Activity  . Alcohol use: No  . Drug use: No  . Sexual activity: Yes    Birth control/protection: None  Lifestyle  . Physical activity:    Days per week: Not on file    Minutes per session: Not on file  . Stress: Not on file  Relationships  . Social connections:    Talks on phone: Not on file    Gets together: Not on file    Attends religious service: Not on file    Active member of club or organization: Not on file    Attends meetings of clubs or organizations: Not on file    Relationship status: Not on file  Other Topics Concern  . Not on file  Social History Narrative   Married, 1 child.   Educ: HS at [employer].   Works at  [employer].   No T/A/Ds.   Intermittent exercising.   Her mom passed away from metastatic breast cancer, summer 2017.   Review of Systems - See HPI.  All other ROS are negative.  BP 104/80   Pulse 99   Temp 98.8 F (37.1 C) (Oral)   Resp 14   Ht 5\' 10"  (1.778 m)   Wt 130 lb (59 kg)   SpO2 99%   BMI 18.65 kg/m   Physical Exam  Constitutional: She is oriented to person, place, and time. She appears well-developed and well-nourished.  HENT:  Head: Normocephalic and atraumatic.  Right Ear: External ear normal. Tympanic membrane is retracted.  Left Ear: External ear normal. Tympanic membrane is retracted.  Nose: Mucosal edema and rhinorrhea present. Right sinus exhibits frontal sinus tenderness. Left sinus exhibits frontal sinus tenderness.  Mouth/Throat: Uvula is midline and oropharynx is clear and moist.  Eyes: Conjunctivae are normal.  Neck: Neck supple.  Cardiovascular:  Normal rate, regular rhythm and normal heart sounds.  Pulmonary/Chest: Effort normal and breath sounds normal. No stridor. No respiratory distress. She has no wheezes. She has no rales. She exhibits no tenderness.  Lymphadenopathy:    She has no cervical adenopathy.  Neurological: She is alert and oriented to person, place, and time.  Skin: Skin is warm.  Vitals reviewed.   Recent Results (from the past 2160 hour(s))  POCT Urinalysis Dipstick     Status: Abnormal   Collection Time: 06/04/17  1:12 PM  Result Value Ref Range   Color, UA Dark Amber    Clarity, UA Cloudy    Glucose, UA negative    Bilirubin, UA negative    Ketones, UA negative    Spec Grav, UA 1.020 1.010 - 1.025   Blood, UA negative    pH, UA 6.0 5.0 - 8.0   Protein, UA trace    Urobilinogen, UA 1.0 0.2 or 1.0 E.U./dL   Nitrite, UA positive    Leukocytes, UA Small (1+) (A) Negative   Appearance     Odor    Urine Culture     Status: Abnormal   Collection Time: 06/04/17  1:23 PM  Result Value Ref Range   MICRO NUMBER: [id number]    SPECIMEN QUALITY: ADEQUATE    Sample Source NOT GIVEN    STATUS: FINAL    ISOLATE 1: Escherichia coli (A)     Comment: Greater than 100,000 CFU/mL of Escherichia coli      Susceptibility   Escherichia coli - URINE CULTURE, REFLEX    AMOX/CLAVULANIC <=2 Sensitive     AMPICILLIN <=2 Sensitive     AMPICILLIN/SULBACTAM <=2 Sensitive     CEFAZOLIN* <=4 Not Reportable      * For infections other than uncomplicated UTIcaused by E. coli, K. pneumoniae or P. mirabilis:Cefazolin is resistant if MIC > or = 8 mcg/mL.(Distinguishing susceptible versus intermediatefor isolates with MIC < or = 4 mcg/mL requiresadditional testing.)For uncomplicated UTI caused by E. coli,K. pneumoniae or P. mirabilis: Cefazolin issusceptible if MIC <32 mcg/mL and predictssusceptible to the oral agents cefaclor, cefdinir,cefpodoxime, cefprozil, cefuroxime, cephalexinand loracarbef.    CEFEPIME <=1 Sensitive      CEFTRIAXONE <=1 Sensitive     CIPROFLOXACIN <=0.25 Sensitive     LEVOFLOXACIN <=0.12 Sensitive     ERTAPENEM <=0.5 Sensitive     GENTAMICIN <=1 Sensitive     IMIPENEM <=0.25 Sensitive     NITROFURANTOIN <=16 Sensitive     PIP/TAZO <=4 Sensitive  TOBRAMYCIN <=1 Sensitive     TRIMETH/SULFA* <=20 Sensitive      * For infections other than uncomplicated UTIcaused by E. coli, K. pneumoniae or P. mirabilis:Cefazolin is resistant if MIC > or = 8 mcg/mL.(Distinguishing susceptible versus intermediatefor isolates with MIC < or = 4 mcg/mL requiresadditional testing.)For uncomplicated UTI caused by E. coli,K. pneumoniae or P. mirabilis: Cefazolin issusceptible if MIC <32 mcg/mL and predictssusceptible to the oral agents cefaclor, cefdinir,cefpodoxime, cefprozil, cefuroxime, cephalexinand loracarbef.Legend:S = Susceptible  I = IntermediateR = Resistant  NS = Not susceptible* = Not tested  NR = Not reported**NN = See antimicrobic comments  POC Influenza A&B(BINAX/QUICKVUE)     Status: Normal   Collection Time: 06/11/17  9:46 AM  Result Value Ref Range   Influenza A, POC Negative Negative   Influenza B, POC  Negative    Assessment/Plan: 1. Acute non-recurrent frontal sinusitis Rx Augmentin.  Increase fluids.  Rest.  Saline nasal spray.  Probiotic.  Mucinex as directed.  Humidifier in bedroom. Resume Flonase and Claritin-D.  Call or return to clinic if symptoms are not improving.  - amoxicillin-clavulanate (AUGMENTIN) 875-125 MG tablet; Take 1 tablet by mouth 2 (two) times daily.  Dispense: 14 tablet; Refill: 0   Piedad ClimesWilliam Cody Jeannetta Cerutti, PA-C

## 2017-07-19 NOTE — Patient Instructions (Signed)
Please take antibiotic as directed.  Increase fluid intake.  Use Saline nasal spray.  Take a daily multivitamin. Resume your Claritin-D and Flonase.  Place a humidifier in the bedroom.  Please call or return clinic if symptoms are not improving.  Sinusitis Sinusitis is redness, soreness, and swelling (inflammation) of the paranasal sinuses. Paranasal sinuses are air pockets within the bones of your face (beneath the eyes, the middle of the forehead, or above the eyes). In healthy paranasal sinuses, mucus is able to drain out, and air is able to circulate through them by way of your nose. However, when your paranasal sinuses are inflamed, mucus and air can become trapped. This can allow bacteria and other germs to grow and cause infection. Sinusitis can develop quickly and last only a short time (acute) or continue over a long period (chronic). Sinusitis that lasts for more than 12 weeks is considered chronic.  CAUSES  Causes of sinusitis include:  Allergies.  Structural abnormalities, such as displacement of the cartilage that separates your nostrils (deviated septum), which can decrease the air flow through your nose and sinuses and affect sinus drainage.  Functional abnormalities, such as when the small hairs (cilia) that line your sinuses and help remove mucus do not work properly or are not present. SYMPTOMS  Symptoms of acute and chronic sinusitis are the same. The primary symptoms are pain and pressure around the affected sinuses. Other symptoms include:  Upper toothache.  Earache.  Headache.  Bad breath.  Decreased sense of smell and taste.  A cough, which worsens when you are lying flat.  Fatigue.  Fever.  Thick drainage from your nose, which often is green and may contain pus (purulent).  Swelling and warmth over the affected sinuses. DIAGNOSIS  Your caregiver will perform a physical exam. During the exam, your caregiver may:  Look in your nose for signs of abnormal  growths in your nostrils (nasal polyps).  Tap over the affected sinus to check for signs of infection.  View the inside of your sinuses (endoscopy) with a special imaging device with a light attached (endoscope), which is inserted into your sinuses. If your caregiver suspects that you have chronic sinusitis, one or more of the following tests may be recommended:  Allergy tests.  Nasal culture A sample of mucus is taken from your nose and sent to a lab and screened for bacteria.  Nasal cytology A sample of mucus is taken from your nose and examined by your caregiver to determine if your sinusitis is related to an allergy. TREATMENT  Most cases of acute sinusitis are related to a viral infection and will resolve on their own within 10 days. Sometimes medicines are prescribed to help relieve symptoms (pain medicine, decongestants, nasal steroid sprays, or saline sprays).  However, for sinusitis related to a bacterial infection, your caregiver will prescribe antibiotic medicines. These are medicines that will help kill the bacteria causing the infection.  Rarely, sinusitis is caused by a fungal infection. In theses cases, your caregiver will prescribe antifungal medicine. For some cases of chronic sinusitis, surgery is needed. Generally, these are cases in which sinusitis recurs more than 3 times per year, despite other treatments. HOME CARE INSTRUCTIONS   Drink plenty of water. Water helps thin the mucus so your sinuses can drain more easily.  Use a humidifier.  Inhale steam 3 to 4 times a day (for example, sit in the bathroom with the shower running).  Apply a warm, moist washcloth to your face 3  to 4 times a day, or as directed by your caregiver.  Use saline nasal sprays to help moisten and clean your sinuses.  Take over-the-counter or prescription medicines for pain, discomfort, or fever only as directed by your caregiver. SEEK IMMEDIATE MEDICAL CARE IF:  You have increasing pain or  severe headaches.  You have nausea, vomiting, or drowsiness.  You have swelling around your face.  You have vision problems.  You have a stiff neck.  You have difficulty breathing. MAKE SURE YOU:   Understand these instructions.  Will watch your condition.  Will get help right away if you are not doing well or get worse. Document Released: 03/13/2005 Document Revised: 06/05/2011 Document Reviewed: 03/28/2011 [facility] Patient Information 2014 [place], [place].  n

## 2017-08-13 DIAGNOSIS — R51 Headache: Secondary | ICD-10-CM | POA: Diagnosis not present

## 2017-08-13 DIAGNOSIS — S335XXA Sprain of ligaments of lumbar spine, initial encounter: Secondary | ICD-10-CM | POA: Diagnosis not present

## 2017-08-13 DIAGNOSIS — S134XXA Sprain of ligaments of cervical spine, initial encounter: Secondary | ICD-10-CM | POA: Diagnosis not present

## 2017-08-16 DIAGNOSIS — S134XXA Sprain of ligaments of cervical spine, initial encounter: Secondary | ICD-10-CM | POA: Diagnosis not present

## 2017-08-16 DIAGNOSIS — S335XXA Sprain of ligaments of lumbar spine, initial encounter: Secondary | ICD-10-CM | POA: Diagnosis not present

## 2017-08-16 DIAGNOSIS — R51 Headache: Secondary | ICD-10-CM | POA: Diagnosis not present

## 2017-08-29 DIAGNOSIS — B360 Pityriasis versicolor: Secondary | ICD-10-CM | POA: Diagnosis not present

## 2017-08-29 DIAGNOSIS — L245 Irritant contact dermatitis due to other chemical products: Secondary | ICD-10-CM | POA: Diagnosis not present

## 2017-08-29 DIAGNOSIS — L282 Other prurigo: Secondary | ICD-10-CM | POA: Diagnosis not present

## 2017-09-25 DIAGNOSIS — S134XXA Sprain of ligaments of cervical spine, initial encounter: Secondary | ICD-10-CM | POA: Diagnosis not present

## 2017-09-25 DIAGNOSIS — R51 Headache: Secondary | ICD-10-CM | POA: Diagnosis not present

## 2017-09-25 DIAGNOSIS — S335XXA Sprain of ligaments of lumbar spine, initial encounter: Secondary | ICD-10-CM | POA: Diagnosis not present

## 2017-11-05 DIAGNOSIS — R51 Headache: Secondary | ICD-10-CM | POA: Diagnosis not present

## 2017-11-05 DIAGNOSIS — S134XXA Sprain of ligaments of cervical spine, initial encounter: Secondary | ICD-10-CM | POA: Diagnosis not present

## 2017-11-05 DIAGNOSIS — S335XXA Sprain of ligaments of lumbar spine, initial encounter: Secondary | ICD-10-CM | POA: Diagnosis not present

## 2017-12-03 ENCOUNTER — Ambulatory Visit: Payer: BLUE CROSS/BLUE SHIELD | Admitting: Physician Assistant

## 2017-12-03 ENCOUNTER — Encounter: Payer: Self-pay | Admitting: Physician Assistant

## 2017-12-03 ENCOUNTER — Other Ambulatory Visit: Payer: Self-pay

## 2017-12-03 VITALS — BP 121/88 | HR 113 | Temp 98.1°F | Resp 16 | Ht 70.0 in | Wt 127.0 lb

## 2017-12-03 DIAGNOSIS — H66001 Acute suppurative otitis media without spontaneous rupture of ear drum, right ear: Secondary | ICD-10-CM | POA: Diagnosis not present

## 2017-12-03 DIAGNOSIS — J302 Other seasonal allergic rhinitis: Secondary | ICD-10-CM

## 2017-12-03 MED ORDER — AMOXICILLIN 875 MG PO TABS: 875.0000 mg | ORAL_TABLET | Freq: Two times a day (BID) | ORAL | 0 refills | Status: DC

## 2017-12-03 MED ORDER — LEVOCETIRIZINE DIHYDROCHLORIDE 5 MG PO TABS: 5.0000 mg | ORAL_TABLET | Freq: Every evening | ORAL | 6 refills | Status: DC

## 2017-12-03 NOTE — Progress Notes (Signed)
Patient presents to clinic today c/o 2 days of nasal congestion, PND and ear pressure but woke up this morning at 2 AM with significant R ear pain. Notes slight muffled hearing. Denies fever, chills, malaise. Denies chest congestion, chest pain. Has been taking some OTC generic claritin. Denies recent travel or sick contact.    Past Medical History:  Diagnosis Date  . Asthma, mild persistent   . Family history of breast cancer in mother    Per oncology, pt should start mammograms at age 17.  Marland Kitchen GAD (generalized anxiety disorder) 11/21/2011  . Migraine without aura    Onset in early teens.  Anywhere from 2 per week to 1 per month.      Current Outpatient Medications on File Prior to Visit  Medication Sig Dispense Refill  . albuterol (VENTOLIN HFA) 108 (90 Base) MCG/ACT inhaler Inhale 2 puffs every 4 (four) hours as needed into the lungs for wheezing. 18 g 5  . fluticasone (FLONASE) 50 MCG/ACT nasal spray Place 2 sprays into both nostrils daily. 16 g 6  . levocetirizine (XYZAL) 5 MG tablet Take 1 tablet (5 mg total) by mouth every evening. 30 tablet 0  . sertraline (ZOLOFT) 50 MG tablet Take 1 tablet (50 mg total) by mouth daily. 90 tablet 1   No current facility-administered medications on file prior to visit.     No Known Allergies  Family History  Problem Relation Age of Onset  . Arthritis Mother   . Cancer Mother 28       breast.  Died of metastatic breast cancer 2017.  Marland Kitchen Hypertension Father   . Arthritis Maternal Grandmother        liver cancer  . Arthritis Maternal Grandfather        stomach cancer  . Diabetes Paternal Grandmother   . Heart attack Paternal Grandmother   . Other Paternal Grandfather        vertigo    Social History   Socioeconomic History  . Marital status: Married    Spouse name: Not on file  . Number of children: Not on file  . Years of education: Not on file  . Highest education level: Not on file  Occupational History  . Not on file    Social Needs  . Financial resource strain: Not on file  . Food insecurity:    Worry: Not on file    Inability: Not on file  . Transportation needs:    Medical: Not on file    Non-medical: Not on file  Tobacco Use  . Smoking status: Never Smoker  . Smokeless tobacco: Never Used  Substance and Sexual Activity  . Alcohol use: No  . Drug use: No  . Sexual activity: Yes    Birth control/protection: None  Lifestyle  . Physical activity:    Days per week: Not on file    Minutes per session: Not on file  . Stress: Not on file  Relationships  . Social connections:    Talks on phone: Not on file    Gets together: Not on file    Attends religious service: Not on file    Active member of club or organization: Not on file    Attends meetings of clubs or organizations: Not on file    Relationship status: Not on file  Other Topics Concern  . Not on file  Social History Narrative   Married, 1 child.   Educ: HS at [employer].   Works at  [employer].   No T/A/Ds.   Intermittent exercising.   Her mom passed away from metastatic breast cancer, summer 2017.   Review of Systems - See HPI.  All other ROS are negative.  BP 121/88   Pulse (!) 113   Temp 98.1 F (36.7 C) (Oral)   Resp 16   Ht 5\' 10"  (1.778 m)   Wt 127 lb (57.6 kg)   SpO2 98%   BMI 18.22 kg/m   Physical Exam  Constitutional: She appears well-developed and well-nourished.  HENT:  Head: Normocephalic and atraumatic.  Right Ear: Tympanic membrane is erythematous and bulging. A middle ear effusion is present.  Left Ear: Tympanic membrane normal.  Nose: Nose normal.  Mouth/Throat: Uvula is midline, oropharynx is clear and moist and mucous membranes are normal.  Cardiovascular: Normal rate, regular rhythm, normal heart sounds and intact distal pulses.  Pulmonary/Chest: Effort normal and breath sounds normal. No stridor. No respiratory distress. She has no wheezes. She has no rales. She  exhibits no tenderness.  Vitals reviewed.  Assessment/Plan: 1. Seasonal allergic rhinitis, unspecified trigger Continue Flonase. Restart Xyzal daily. Avoid triggers when possible.  - levocetirizine (XYZAL) 5 MG tablet; Take 1 tablet (5 mg total) by mouth every evening.  Dispense: 30 tablet; Refill: 6  2. Non-recurrent acute suppurative otitis media of right ear without spontaneous rupture of tympanic membrane Start ABX today. Supportive measures and OTC medications reviewed. Follow-up if symptoms are not improving/resolving. - amoxicillin (AMOXIL) 875 MG tablet; Take 1 tablet (875 mg total) by mouth 2 (two) times daily.  Dispense: 20 tablet; Refill: 0   Piedad Climes, PA-C

## 2017-12-03 NOTE — Patient Instructions (Signed)
Please keep well-hydrated and get plenty of rest.  Restart Xyzal daily. Resume your Flonase. Take the Amoxicillin with food to get rid of infection. Place a humidifier in the bedroom if you have on. Alternate Tylenol and Ibuprofen if needed for pain.  Follow-up if symptoms are not improving over the next few days.

## 2017-12-06 ENCOUNTER — Telehealth: Payer: Self-pay | Admitting: Physician Assistant

## 2017-12-06 ENCOUNTER — Encounter: Payer: Self-pay | Admitting: Physician Assistant

## 2017-12-06 NOTE — Telephone Encounter (Signed)
Copied from CRM [phone]. Topic: Quick Communication - See Telephone Encounter >> Dec 06, 2017 10:44 AM Jolayne Hainesaylor, Brittany L wrote: CRM for notification. See Telephone encounter for: 12/06/17.  Patient was in on Monday and Malva CoganCody Martin gave her amoxicillin (AMOXIL) 875 MG tablet. She took her allergy medication levocetirizine (XYZAL) 5 MG tablet to help with the drainage. She does not think she is getting much relief and would like to know what Selena BattenCody suggest to do now?  [id number] - [place], [place] - [address] [place] [id number]

## 2017-12-06 NOTE — Telephone Encounter (Signed)
Would recommend she make sure to do saline rinses to flush out the drainage, using her Flonase directed afterwards  Once daily. Continue the antibiotic as directed. If she has a diffuser in the house or would like to get one to have, eucalyptus is very beneficial for nasal/sinus congestion. She can switch the Xyzal short-term for Claritin-D or Zyrtec-D to offer allergy protection along with a decongestant.

## 2017-12-06 NOTE — Telephone Encounter (Signed)
Called patient and gave her the message from Haroldody. Patient verbalized an understanding.

## 2017-12-06 NOTE — Telephone Encounter (Signed)
Last OV 12/03/17, No future OV

## 2017-12-11 DIAGNOSIS — S335XXA Sprain of ligaments of lumbar spine, initial encounter: Secondary | ICD-10-CM | POA: Diagnosis not present

## 2017-12-11 DIAGNOSIS — S134XXA Sprain of ligaments of cervical spine, initial encounter: Secondary | ICD-10-CM | POA: Diagnosis not present

## 2017-12-11 DIAGNOSIS — R51 Headache: Secondary | ICD-10-CM | POA: Diagnosis not present

## 2018-01-01 DIAGNOSIS — D2271 Melanocytic nevi of right lower limb, including hip: Secondary | ICD-10-CM | POA: Diagnosis not present

## 2018-01-01 DIAGNOSIS — D225 Melanocytic nevi of trunk: Secondary | ICD-10-CM | POA: Diagnosis not present

## 2018-01-01 DIAGNOSIS — L918 Other hypertrophic disorders of the skin: Secondary | ICD-10-CM | POA: Diagnosis not present

## 2018-01-01 DIAGNOSIS — D485 Neoplasm of uncertain behavior of skin: Secondary | ICD-10-CM | POA: Diagnosis not present

## 2018-01-01 DIAGNOSIS — L821 Other seborrheic keratosis: Secondary | ICD-10-CM | POA: Diagnosis not present

## 2018-01-01 DIAGNOSIS — D2261 Melanocytic nevi of right upper limb, including shoulder: Secondary | ICD-10-CM | POA: Diagnosis not present

## 2018-01-02 DIAGNOSIS — R51 Headache: Secondary | ICD-10-CM | POA: Diagnosis not present

## 2018-01-02 DIAGNOSIS — S335XXA Sprain of ligaments of lumbar spine, initial encounter: Secondary | ICD-10-CM | POA: Diagnosis not present

## 2018-01-02 DIAGNOSIS — S134XXA Sprain of ligaments of cervical spine, initial encounter: Secondary | ICD-10-CM | POA: Diagnosis not present

## 2018-02-04 DIAGNOSIS — S134XXA Sprain of ligaments of cervical spine, initial encounter: Secondary | ICD-10-CM | POA: Diagnosis not present

## 2018-02-04 DIAGNOSIS — R51 Headache: Secondary | ICD-10-CM | POA: Diagnosis not present

## 2018-02-04 DIAGNOSIS — S335XXA Sprain of ligaments of lumbar spine, initial encounter: Secondary | ICD-10-CM | POA: Diagnosis not present

## 2018-02-18 ENCOUNTER — Encounter: Payer: Self-pay | Admitting: Physician Assistant

## 2018-02-18 ENCOUNTER — Ambulatory Visit: Payer: BLUE CROSS/BLUE SHIELD | Admitting: Physician Assistant

## 2018-02-18 ENCOUNTER — Other Ambulatory Visit: Payer: Self-pay

## 2018-02-18 VITALS — BP 108/80 | HR 102 | Temp 99.0°F | Resp 14 | Ht 70.0 in | Wt 130.0 lb

## 2018-02-18 DIAGNOSIS — B9689 Other specified bacterial agents as the cause of diseases classified elsewhere: Secondary | ICD-10-CM | POA: Diagnosis not present

## 2018-02-18 DIAGNOSIS — J019 Acute sinusitis, unspecified: Secondary | ICD-10-CM

## 2018-02-18 MED ORDER — AMOXICILLIN-POT CLAVULANATE 875-125 MG PO TABS: 1.0000 | ORAL_TABLET | Freq: Two times a day (BID) | ORAL | 0 refills | Status: DC

## 2018-02-18 NOTE — Patient Instructions (Signed)
Please take antibiotic as directed.  Increase fluid intake.  Use Saline nasal spray.  Take a daily multivitamin. Continue Claritin-D and Flonase. Get some Zarbee's cough syrup or Delsym to use as directed  Place a humidifier in the bedroom.  Please call or return clinic if symptoms are not improving.  Sinusitis Sinusitis is redness, soreness, and swelling (inflammation) of the paranasal sinuses. Paranasal sinuses are air pockets within the bones of your face (beneath the eyes, the middle of the forehead, or above the eyes). In healthy paranasal sinuses, mucus is able to drain out, and air is able to circulate through them by way of your nose. However, when your paranasal sinuses are inflamed, mucus and air can become trapped. This can allow bacteria and other germs to grow and cause infection. Sinusitis can develop quickly and last only a short time (acute) or continue over a long period (chronic). Sinusitis that lasts for more than 12 weeks is considered chronic.  CAUSES  Causes of sinusitis include:  Allergies.  Structural abnormalities, such as displacement of the cartilage that separates your nostrils (deviated septum), which can decrease the air flow through your nose and sinuses and affect sinus drainage.  Functional abnormalities, such as when the small hairs (cilia) that line your sinuses and help remove mucus do not work properly or are not present. SYMPTOMS  Symptoms of acute and chronic sinusitis are the same. The primary symptoms are pain and pressure around the affected sinuses. Other symptoms include:  Upper toothache.  Earache.  Headache.  Bad breath.  Decreased sense of smell and taste.  A cough, which worsens when you are lying flat.  Fatigue.  Fever.  Thick drainage from your nose, which often is green and may contain pus (purulent).  Swelling and warmth over the affected sinuses. DIAGNOSIS  Your caregiver will perform a physical exam. During the exam, your  caregiver may:  Look in your nose for signs of abnormal growths in your nostrils (nasal polyps).  Tap over the affected sinus to check for signs of infection.  View the inside of your sinuses (endoscopy) with a special imaging device with a light attached (endoscope), which is inserted into your sinuses. If your caregiver suspects that you have chronic sinusitis, one or more of the following tests may be recommended:  Allergy tests.  Nasal culture A sample of mucus is taken from your nose and sent to a lab and screened for bacteria.  Nasal cytology A sample of mucus is taken from your nose and examined by your caregiver to determine if your sinusitis is related to an allergy. TREATMENT  Most cases of acute sinusitis are related to a viral infection and will resolve on their own within 10 days. Sometimes medicines are prescribed to help relieve symptoms (pain medicine, decongestants, nasal steroid sprays, or saline sprays).  However, for sinusitis related to a bacterial infection, your caregiver will prescribe antibiotic medicines. These are medicines that will help kill the bacteria causing the infection.  Rarely, sinusitis is caused by a fungal infection. In theses cases, your caregiver will prescribe antifungal medicine. For some cases of chronic sinusitis, surgery is needed. Generally, these are cases in which sinusitis recurs more than 3 times per year, despite other treatments. HOME CARE INSTRUCTIONS   Drink plenty of water. Water helps thin the mucus so your sinuses can drain more easily.  Use a humidifier.  Inhale steam 3 to 4 times a day (for example, sit in the bathroom with the shower running).  Apply a warm, moist washcloth to your face 3 to 4 times a day, or as directed by your caregiver.  Use saline nasal sprays to help moisten and clean your sinuses.  Take over-the-counter or prescription medicines for pain, discomfort, or fever only as directed by your caregiver. SEEK  IMMEDIATE MEDICAL CARE IF:  You have increasing pain or severe headaches.  You have nausea, vomiting, or drowsiness.  You have swelling around your face.  You have vision problems.  You have a stiff neck.  You have difficulty breathing. MAKE SURE YOU:   Understand these instructions.  Will watch your condition.  Will get help right away if you are not doing well or get worse. Document Released: 03/13/2005 Document Revised: 06/05/2011 Document Reviewed: 03/28/2011 [facility] Patient Information 2014 [place], [place].

## 2018-02-18 NOTE — Progress Notes (Signed)
Patient presents to clinic today c/o > 1 week of sore throat, PND, sinus pressure, facial pain and bilateral ear pain. Denies chest congestion. Does note dry cough. Has noted low-grade fever. Denies recent travel or sick contact. Has taken Claritin-D.   Past Medical History:  Diagnosis Date  . Asthma, mild persistent   . Family history of breast cancer in mother    Per oncology, pt should start mammograms at age 32.  Marland Kitchen GAD (generalized anxiety disorder) 11/21/2011  . Migraine without aura    Onset in early teens.  Anywhere from 2 per week to 1 per month.      Current Outpatient Medications on File Prior to Visit  Medication Sig Dispense Refill  . albuterol (VENTOLIN HFA) 108 (90 Base) MCG/ACT inhaler Inhale 2 puffs every 4 (four) hours as needed into the lungs for wheezing. 18 g 5  . fluticasone (FLONASE) 50 MCG/ACT nasal spray Place 2 sprays into both nostrils daily. 16 g 6  . sertraline (ZOLOFT) 50 MG tablet Take 1 tablet (50 mg total) by mouth daily. 90 tablet 1  . levocetirizine (XYZAL) 5 MG tablet Take 1 tablet (5 mg total) by mouth every evening. (Patient not taking: Reported on 02/18/2018) 30 tablet 6   No current facility-administered medications on file prior to visit.     No Known Allergies  Family History  Problem Relation Age of Onset  . Arthritis Mother   . Cancer Mother 66       breast.  Died of metastatic breast cancer 2017.  Marland Kitchen Hypertension Father   . Arthritis Maternal Grandmother        liver cancer  . Arthritis Maternal Grandfather        stomach cancer  . Diabetes Paternal Grandmother   . Heart attack Paternal Grandmother   . Other Paternal Grandfather        vertigo    Social History   Socioeconomic History  . Marital status: Married    Spouse name: Not on file  . Number of children: Not on file  . Years of education: Not on file  . Highest education level: Not on file  Occupational History  . Not on file  Social Needs  . Financial resource  strain: Not on file  . Food insecurity:    Worry: Not on file    Inability: Not on file  . Transportation needs:    Medical: Not on file    Non-medical: Not on file  Tobacco Use  . Smoking status: Never Smoker  . Smokeless tobacco: Never Used  Substance and Sexual Activity  . Alcohol use: No  . Drug use: No  . Sexual activity: Yes    Birth control/protection: None  Lifestyle  . Physical activity:    Days per week: Not on file    Minutes per session: Not on file  . Stress: Not on file  Relationships  . Social connections:    Talks on phone: Not on file    Gets together: Not on file    Attends religious service: Not on file    Active member of club or organization: Not on file    Attends meetings of clubs or organizations: Not on file    Relationship status: Not on file  Other Topics Concern  . Not on file  Social History Narrative   Married, 1 child.   Educ: HS at [employer].   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   No T/A/Ds.  Intermittent exercising.   Her mom passed away from metastatic breast cancer, summer 2017.   Review of Systems - See HPI.  All other ROS are negative.  BP 108/80   Pulse (!) 102   Temp 99 F (37.2 C) (Oral)   Resp 14   Ht 5\' 10"  (1.778 m)   Wt 130 lb (59 kg)   SpO2 99%   BMI 18.65 kg/m   Physical Exam  Constitutional: She is oriented to person, place, and time. She appears well-developed and well-nourished.  HENT:  Head: Normocephalic and atraumatic.  Eyes: Conjunctivae are normal.  Neck: Neck supple.  Cardiovascular: Normal rate, regular rhythm, normal heart sounds and intact distal pulses.  Pulmonary/Chest: Effort normal and breath sounds normal. No stridor. No respiratory distress. She has no wheezes. She has no rales. She exhibits no tenderness.  Neurological: She is alert and oriented to person, place, and time.  Psychiatric: She has a normal mood and affect.  Vitals reviewed.  Assessment/Plan: 1. Acute  bacterial sinusitis Rx Augmentin.  Increase fluids.  Rest.  Saline nasal spray.  Probiotic.  Mucinex as directed.  Humidifier in bedroom.  Call or return to clinic if symptoms are not improving.  - amoxicillin-clavulanate (AUGMENTIN) 875-125 MG tablet; Take 1 tablet by mouth 2 (two) times daily.  Dispense: 14 tablet; Refill: 0   Piedad ClimesWilliam Cody Mallory Schaad, PA-C

## 2018-02-28 ENCOUNTER — Other Ambulatory Visit: Payer: Self-pay | Admitting: Emergency Medicine

## 2018-02-28 DIAGNOSIS — J4520 Mild intermittent asthma, uncomplicated: Secondary | ICD-10-CM

## 2018-02-28 MED ORDER — ALBUTEROL SULFATE HFA 108 (90 BASE) MCG/ACT IN AERS: 2.0000 | INHALATION_SPRAY | RESPIRATORY_TRACT | 5 refills | Status: DC | PRN

## 2018-03-12 DIAGNOSIS — S335XXA Sprain of ligaments of lumbar spine, initial encounter: Secondary | ICD-10-CM | POA: Diagnosis not present

## 2018-03-12 DIAGNOSIS — R51 Headache: Secondary | ICD-10-CM | POA: Diagnosis not present

## 2018-03-12 DIAGNOSIS — S134XXA Sprain of ligaments of cervical spine, initial encounter: Secondary | ICD-10-CM | POA: Diagnosis not present

## 2018-03-26 ENCOUNTER — Encounter: Payer: Self-pay | Admitting: Physician Assistant

## 2018-04-02 ENCOUNTER — Ambulatory Visit: Payer: BLUE CROSS/BLUE SHIELD | Admitting: Physician Assistant

## 2018-04-02 ENCOUNTER — Other Ambulatory Visit: Payer: Self-pay

## 2018-04-02 ENCOUNTER — Encounter: Payer: Self-pay | Admitting: Physician Assistant

## 2018-04-02 VITALS — BP 100/70 | HR 99 | Temp 98.2°F | Resp 14 | Ht 70.0 in | Wt 130.0 lb

## 2018-04-02 DIAGNOSIS — J0110 Acute frontal sinusitis, unspecified: Secondary | ICD-10-CM

## 2018-04-02 MED ORDER — DOXYCYCLINE HYCLATE 100 MG PO CAPS: 100.0000 mg | ORAL_CAPSULE | Freq: Two times a day (BID) | ORAL | 0 refills | Status: DC

## 2018-04-02 NOTE — Patient Instructions (Signed)
Please take antibiotic as directed.  Increase fluid intake.  Use Saline nasal spray.  Take a daily multivitamin. Continue Sudafed.  Place a humidifier in the bedroom.  Please call or return clinic if symptoms are not improving.  Remember to use the heating pad and some topical Icy Hot as directed for the side when needed!  Sinusitis Sinusitis is redness, soreness, and swelling (inflammation) of the paranasal sinuses. Paranasal sinuses are air pockets within the bones of your face (beneath the eyes, the middle of the forehead, or above the eyes). In healthy paranasal sinuses, mucus is able to drain out, and air is able to circulate through them by way of your nose. However, when your paranasal sinuses are inflamed, mucus and air can become trapped. This can allow bacteria and other germs to grow and cause infection. Sinusitis can develop quickly and last only a short time (acute) or continue over a long period (chronic). Sinusitis that lasts for more than 12 weeks is considered chronic.  CAUSES  Causes of sinusitis include:  Allergies.  Structural abnormalities, such as displacement of the cartilage that separates your nostrils (deviated septum), which can decrease the air flow through your nose and sinuses and affect sinus drainage.  Functional abnormalities, such as when the small hairs (cilia) that line your sinuses and help remove mucus do not work properly or are not present. SYMPTOMS  Symptoms of acute and chronic sinusitis are the same. The primary symptoms are pain and pressure around the affected sinuses. Other symptoms include:  Upper toothache.  Earache.  Headache.  Bad breath.  Decreased sense of smell and taste.  A cough, which worsens when you are lying flat.  Fatigue.  Fever.  Thick drainage from your nose, which often is green and may contain pus (purulent).  Swelling and warmth over the affected sinuses. DIAGNOSIS  Your caregiver will perform a physical exam.  During the exam, your caregiver may:  Look in your nose for signs of abnormal growths in your nostrils (nasal polyps).  Tap over the affected sinus to check for signs of infection.  View the inside of your sinuses (endoscopy) with a special imaging device with a light attached (endoscope), which is inserted into your sinuses. If your caregiver suspects that you have chronic sinusitis, one or more of the following tests may be recommended:  Allergy tests.  Nasal culture A sample of mucus is taken from your nose and sent to a lab and screened for bacteria.  Nasal cytology A sample of mucus is taken from your nose and examined by your caregiver to determine if your sinusitis is related to an allergy. TREATMENT  Most cases of acute sinusitis are related to a viral infection and will resolve on their own within 10 days. Sometimes medicines are prescribed to help relieve symptoms (pain medicine, decongestants, nasal steroid sprays, or saline sprays).  However, for sinusitis related to a bacterial infection, your caregiver will prescribe antibiotic medicines. These are medicines that will help kill the bacteria causing the infection.  Rarely, sinusitis is caused by a fungal infection. In theses cases, your caregiver will prescribe antifungal medicine. For some cases of chronic sinusitis, surgery is needed. Generally, these are cases in which sinusitis recurs more than 3 times per year, despite other treatments. HOME CARE INSTRUCTIONS   Drink plenty of water. Water helps thin the mucus so your sinuses can drain more easily.  Use a humidifier.  Inhale steam 3 to 4 times a day (for example, sit in  the bathroom with the shower running).  Apply a warm, moist washcloth to your face 3 to 4 times a day, or as directed by your caregiver.  Use saline nasal sprays to help moisten and clean your sinuses.  Take over-the-counter or prescription medicines for pain, discomfort, or fever only as directed by  your caregiver. SEEK IMMEDIATE MEDICAL CARE IF:  You have increasing pain or severe headaches.  You have nausea, vomiting, or drowsiness.  You have swelling around your face.  You have vision problems.  You have a stiff neck.  You have difficulty breathing. MAKE SURE YOU:   Understand these instructions.  Will watch your condition.  Will get help right away if you are not doing well or get worse. Document Released: 03/13/2005 Document Revised: 06/05/2011 Document Reviewed: 03/28/2011 [facility] Patient Information 2014 [place], [place].

## 2018-04-02 NOTE — Progress Notes (Signed)
Patient presents to clinic today c/o 2.5-3 weeks of frontal sinus pressure and pain associated with nasal congestion, PND, ear pressure. Notes cough that is dry but sometimes productive of green sputum that is mostly from drainage.. Denies fever. Denies recent travel or sick contact. Has been taking Claritin-D and her Flonase that has only helped slightly with symptoms.   Past Medical History:  Diagnosis Date  . Asthma, mild persistent   . Family history of breast cancer in mother    Per oncology, pt should start mammograms at age 64.  Marland Kitchen GAD (generalized anxiety disorder) 11/21/2011  . Migraine without aura    Onset in early teens.  Anywhere from 2 per week to 1 per month.      Current Outpatient Medications on File Prior to Visit  Medication Sig Dispense Refill  . albuterol (VENTOLIN HFA) 108 (90 Base) MCG/ACT inhaler Inhale 2 puffs into the lungs every 4 (four) hours as needed for wheezing. 18 g 5  . fluticasone (FLONASE) 50 MCG/ACT nasal spray Place 2 sprays into both nostrils daily. 16 g 6  . sertraline (ZOLOFT) 50 MG tablet Take 1 tablet (50 mg total) by mouth daily. 90 tablet 1   No current facility-administered medications on file prior to visit.     No Known Allergies  Family History  Problem Relation Age of Onset  . Arthritis Mother   . Cancer Mother 73       breast.  Died of metastatic breast cancer 2017.  Marland Kitchen Hypertension Father   . Arthritis Maternal Grandmother        liver cancer  . Arthritis Maternal Grandfather        stomach cancer  . Diabetes Paternal Grandmother   . Heart attack Paternal Grandmother   . Other Paternal Grandfather        vertigo    Social History   Socioeconomic History  . Marital status: Married    Spouse name: Not on file  . Number of children: Not on file  . Years of education: Not on file  . Highest education level: Not on file  Occupational History  . Not on file  Social Needs  . Financial resource strain: Not on file  . Food  insecurity:    Worry: Not on file    Inability: Not on file  . Transportation needs:    Medical: Not on file    Non-medical: Not on file  Tobacco Use  . Smoking status: Never Smoker  . Smokeless tobacco: Never Used  Substance and Sexual Activity  . Alcohol use: No  . Drug use: No  . Sexual activity: Yes    Birth control/protection: None  Lifestyle  . Physical activity:    Days per week: Not on file    Minutes per session: Not on file  . Stress: Not on file  Relationships  . Social connections:    Talks on phone: Not on file    Gets together: Not on file    Attends religious service: Not on file    Active member of club or organization: Not on file    Attends meetings of clubs or organizations: Not on file    Relationship status: Not on file  Other Topics Concern  . Not on file  Social History Narrative   Married, 1 child.   Educ: HS at [employer].   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   No T/A/Ds.   Intermittent exercising.   Her mom passed  away from metastatic breast cancer, summer 2017.   Review of Systems - See HPI.  All other ROS are negative.  BP 100/70   Pulse 99   Temp 98.2 F (36.8 C) (Oral)   Resp 14   Wt 130 lb (59 kg)   SpO2 99%   BMI 18.65 kg/m   Physical Exam Vitals signs reviewed.  Constitutional:      Appearance: Normal appearance.  HENT:     Head: Normocephalic and atraumatic.     Right Ear: Tympanic membrane normal.     Left Ear: Tympanic membrane normal.     Nose: Congestion present.     Right Turbinates: Enlarged and swollen.     Left Turbinates: Enlarged and swollen.     Right Sinus: Frontal sinus tenderness present.     Mouth/Throat:     Mouth: Mucous membranes are moist.  Eyes:     Conjunctiva/sclera: Conjunctivae normal.  Neck:     Musculoskeletal: Neck supple.  Cardiovascular:     Rate and Rhythm: Normal rate and regular rhythm.     Pulses: Normal pulses.     Heart sounds: Normal heart sounds.  Pulmonary:      Effort: Pulmonary effort is normal.     Breath sounds: Normal breath sounds.  Neurological:     General: No focal deficit present.     Mental Status: She is alert and oriented to person, place, and time.  Psychiatric:        Mood and Affect: Mood normal.     Assessment/Plan: 1. Acute non-recurrent frontal sinusitis Rx Doxycycline.  Increase fluids.  Rest.  Saline nasal spray.  Probiotic.  Mucinex as directed.  Humidifier in bedroom.  Call or return to clinic if symptoms are not improving.  - doxycycline (VIBRAMYCIN) 100 MG capsule; Take 1 capsule (100 mg total) by mouth 2 (two) times daily.  Dispense: 20 capsule; Refill: 0   Piedad ClimesWilliam Cody Marquis Down, PA-C

## 2018-04-05 ENCOUNTER — Encounter: Payer: Self-pay | Admitting: Physician Assistant

## 2018-06-04 ENCOUNTER — Ambulatory Visit: Payer: BLUE CROSS/BLUE SHIELD | Admitting: Physician Assistant

## 2018-06-18 ENCOUNTER — Encounter: Payer: Self-pay | Admitting: Physician Assistant

## 2018-06-18 MED ORDER — FLUTICASONE PROPIONATE HFA 44 MCG/ACT IN AERO: 2.0000 | INHALATION_SPRAY | Freq: Two times a day (BID) | RESPIRATORY_TRACT | 1 refills | Status: DC

## 2018-06-19 ENCOUNTER — Other Ambulatory Visit: Payer: Self-pay | Admitting: Physician Assistant

## 2018-06-19 MED ORDER — MONTELUKAST SODIUM 10 MG PO TABS: 10.0000 mg | ORAL_TABLET | Freq: Every day | ORAL | 3 refills | Status: DC

## 2018-06-21 ENCOUNTER — Ambulatory Visit (INDEPENDENT_AMBULATORY_CARE_PROVIDER_SITE_OTHER): Payer: BLUE CROSS/BLUE SHIELD | Admitting: Physician Assistant

## 2018-06-21 ENCOUNTER — Other Ambulatory Visit: Payer: Self-pay

## 2018-06-21 ENCOUNTER — Encounter: Payer: Self-pay | Admitting: Physician Assistant

## 2018-06-21 DIAGNOSIS — J0100 Acute maxillary sinusitis, unspecified: Secondary | ICD-10-CM | POA: Diagnosis not present

## 2018-06-21 MED ORDER — AMOXICILLIN-POT CLAVULANATE 875-125 MG PO TABS: 1.0000 | ORAL_TABLET | Freq: Two times a day (BID) | ORAL | 0 refills | Status: DC

## 2018-06-21 MED ORDER — FLUTICASONE PROPIONATE 50 MCG/ACT NA SUSP: 2.0000 | Freq: Every day | NASAL | 6 refills | Status: DC

## 2018-06-21 NOTE — Progress Notes (Signed)
I have discussed the procedure for the virtual visit with the patient who has given consent to proceed with assessment and treatment.   Janet Hall S Saman Giddens, CMA     

## 2018-06-21 NOTE — Patient Instructions (Signed)
Please take antibiotic as directed.  Increase fluid intake.  Use Saline nasal spray.  Take a daily multivitamin. Restart your Flonase. Stop the Xyzal and start Claritin-D for now.  Place a humidifier in the bedroom.  Please call or return clinic if symptoms are not improving.  Sinusitis Sinusitis is redness, soreness, and swelling (inflammation) of the paranasal sinuses. Paranasal sinuses are air pockets within the bones of your face (beneath the eyes, the middle of the forehead, or above the eyes). In healthy paranasal sinuses, mucus is able to drain out, and air is able to circulate through them by way of your nose. However, when your paranasal sinuses are inflamed, mucus and air can become trapped. This can allow bacteria and other germs to grow and cause infection. Sinusitis can develop quickly and last only a short time (acute) or continue over a long period (chronic). Sinusitis that lasts for more than 12 weeks is considered chronic.  CAUSES  Causes of sinusitis include:  Allergies.  Structural abnormalities, such as displacement of the cartilage that separates your nostrils (deviated septum), which can decrease the air flow through your nose and sinuses and affect sinus drainage.  Functional abnormalities, such as when the small hairs (cilia) that line your sinuses and help remove mucus do not work properly or are not present. SYMPTOMS  Symptoms of acute and chronic sinusitis are the same. The primary symptoms are pain and pressure around the affected sinuses. Other symptoms include:  Upper toothache.  Earache.  Headache.  Bad breath.  Decreased sense of smell and taste.  A cough, which worsens when you are lying flat.  Fatigue.  Fever.  Thick drainage from your nose, which often is green and may contain pus (purulent).  Swelling and warmth over the affected sinuses. DIAGNOSIS  Your caregiver will perform a physical exam. During the exam, your caregiver may:  Look in your  nose for signs of abnormal growths in your nostrils (nasal polyps).  Tap over the affected sinus to check for signs of infection.  View the inside of your sinuses (endoscopy) with a special imaging device with a light attached (endoscope), which is inserted into your sinuses. If your caregiver suspects that you have chronic sinusitis, one or more of the following tests may be recommended:  Allergy tests.  Nasal culture A sample of mucus is taken from your nose and sent to a lab and screened for bacteria.  Nasal cytology A sample of mucus is taken from your nose and examined by your caregiver to determine if your sinusitis is related to an allergy. TREATMENT  Most cases of acute sinusitis are related to a viral infection and will resolve on their own within 10 days. Sometimes medicines are prescribed to help relieve symptoms (pain medicine, decongestants, nasal steroid sprays, or saline sprays).  However, for sinusitis related to a bacterial infection, your caregiver will prescribe antibiotic medicines. These are medicines that will help kill the bacteria causing the infection.  Rarely, sinusitis is caused by a fungal infection. In theses cases, your caregiver will prescribe antifungal medicine. For some cases of chronic sinusitis, surgery is needed. Generally, these are cases in which sinusitis recurs more than 3 times per year, despite other treatments. HOME CARE INSTRUCTIONS   Drink plenty of water. Water helps thin the mucus so your sinuses can drain more easily.  Use a humidifier.  Inhale steam 3 to 4 times a day (for example, sit in the bathroom with the shower running).  Apply a warm,  moist washcloth to your face 3 to 4 times a day, or as directed by your caregiver.  Use saline nasal sprays to help moisten and clean your sinuses.  Take over-the-counter or prescription medicines for pain, discomfort, or fever only as directed by your caregiver. SEEK IMMEDIATE MEDICAL CARE IF:   You have increasing pain or severe headaches.  You have nausea, vomiting, or drowsiness.  You have swelling around your face.  You have vision problems.  You have a stiff neck.  You have difficulty breathing. MAKE SURE YOU:   Understand these instructions.  Will watch your condition.  Will get help right away if you are not doing well or get worse. Document Released: 03/13/2005 Document Revised: 06/05/2011 Document Reviewed: 03/28/2011 [facility] Patient Information 2014 [place], [place].

## 2018-06-21 NOTE — Progress Notes (Signed)
Virtual Visit via Video Note  I connected with Janet Hall on 06/21/18 at  3:00 PM EDT by a video enabled telemedicine application and verified that I am speaking with the correct person using two identifiers.   I discussed the limitations of evaluation and management by telemedicine and the availability of in person appointments. The patient expressed understanding and agreed to proceed.  History of Present Illness: Patient with history significant for chronic rhinitis, ETD, and AOM, presenting for 2 weeks of intermittent nasal congestion and sinus pressure with sinus headache. Now with 1-2 days of sinus pain, facial pain.   Observations/Objective: Normocephalic and atraumatic. Normal, non-pressured speech. No labored breathing.  Alert and oriented x 3.   Assessment and Plan: 1. Acute non-recurrent maxillary sinusitis Rx Augmentin.  Increase fluids.  Rest.  Saline nasal spray.  Probiotic.  Mucinex as directed.  Humidifier in bedroom. Resume Flonase (refill sent). Stop Xyzal for now and start Claritin-D.  Call or return to clinic if symptoms are not improving.   Follow Up Instructions: PRN if symptoms are not improving or resolving.   I discussed the assessment and treatment plan with the patient. The patient was provided an opportunity to ask questions and all were answered. The patient agreed with the plan and demonstrated an understanding of the instructions.   The patient was advised to call back or seek an in-person evaluation if the symptoms worsen or if the condition fails to improve as anticipated.  I provided 15 minutes of non-face-to-face time during this encounter.   Piedad Climes, PA-C

## 2018-06-25 ENCOUNTER — Encounter: Payer: Self-pay | Admitting: Physician Assistant

## 2018-07-16 ENCOUNTER — Ambulatory Visit: Payer: Self-pay | Admitting: Physician Assistant

## 2018-07-16 NOTE — Telephone Encounter (Signed)
I returned pt's call.   She stepped on an old rusty nail today.   She got the bleeding stopped and cleaned it with peroxide and put Neosporin on it.   She was wondering if she needed a tetanus shot.   She's had one within the last ten years.   I checked her chart and it was given 08/11/2014. I called Sherley Bounds" Martin's office and let them know what happened to verify she did not need another tetanus shot or needed to be seen.   They also said she did not need another tetanus shot.   They didn't need to see her unless she starts getting signs of infection so not referred to the ED at this time.  I went over the s/s of infection to watch for and to call us back for.   She verbalized understanding.    Reason for Disposition . Patient sounds very sick or weak to the triager    Stepped on a rusty nail today  Answer Assessment - Initial Assessment Questions 1. ONSET: "When did the pain start?"      I stepped on a rusty nail.   I was working outside.   I felt pain and an old rusty nail in it.  I got the bleeding stopped  I cleaned it with peroxide and put Neosporin on it.    Last tetanus was 2015. 2. LOCATION: "Where is the pain located?"      Bottom near my heel. 3. PAIN: "How bad is the pain?"    (Scale 1-10; or mild, moderate, severe)   -  MILD (1-3): doesn't interfere with normal activities    -  MODERATE (4-7): interferes with normal activities (e.g., work or school) or awakens from sleep, limping    -  SEVERE (8-10): excruciating pain, unable to do any normal activities, unable to walk     Hurts a little but not terrible. 4. WORK OR EXERCISE: "Has there been any recent work or exercise that involved this part of the body?"      No 5. CAUSE: "What do you think is causing the foot pain?"     Stepped on a nail 6. OTHER SYMPTOMS: "Do you have any other symptoms?" (e.g., leg pain, rash, fever, numbness)     It feels tingly on the side above my ankle.  It's red and itchy a little. 7.  PREGNANCY: "Is there any chance you are pregnant?" "When was your last menstrual period?"     Not that I'm aware of I had a period last month.  Protocols used: FOOT PAIN-A-AH

## 2018-09-16 DIAGNOSIS — D225 Melanocytic nevi of trunk: Secondary | ICD-10-CM | POA: Diagnosis not present

## 2018-09-16 DIAGNOSIS — D2271 Melanocytic nevi of right lower limb, including hip: Secondary | ICD-10-CM | POA: Diagnosis not present

## 2018-09-16 DIAGNOSIS — B360 Pityriasis versicolor: Secondary | ICD-10-CM | POA: Diagnosis not present

## 2018-09-16 DIAGNOSIS — D2262 Melanocytic nevi of left upper limb, including shoulder: Secondary | ICD-10-CM | POA: Diagnosis not present

## 2018-11-14 ENCOUNTER — Encounter: Payer: Self-pay | Admitting: Physician Assistant

## 2018-11-19 ENCOUNTER — Ambulatory Visit (INDEPENDENT_AMBULATORY_CARE_PROVIDER_SITE_OTHER): Payer: BC Managed Care – PPO | Admitting: Physician Assistant

## 2018-11-19 ENCOUNTER — Encounter: Payer: Self-pay | Admitting: Physician Assistant

## 2018-11-19 ENCOUNTER — Other Ambulatory Visit: Payer: Self-pay

## 2018-11-19 DIAGNOSIS — J0100 Acute maxillary sinusitis, unspecified: Secondary | ICD-10-CM

## 2018-11-19 MED ORDER — AMOXICILLIN-POT CLAVULANATE 875-125 MG PO TABS: 1.0000 | ORAL_TABLET | Freq: Two times a day (BID) | ORAL | 0 refills | Status: DC

## 2018-11-19 MED ORDER — AZELASTINE HCL 0.1 % NA SOLN: 2.0000 | Freq: Two times a day (BID) | NASAL | 1 refills | Status: DC

## 2018-11-19 NOTE — Progress Notes (Signed)
   Virtual Visit via Video   I connected with patient on 11/19/18 at 11:00 AM EDT by a video enabled telemedicine application and verified that I am speaking with the correct person using two identifiers.  Location patient: Home Location provider: [facility], Office Persons participating in the virtual visit: Patient, Provider, [place] (Patina Moore)  I discussed the limitations of evaluation and management by telemedicine and the availability of in person appointments. The patient expressed understanding and agreed to proceed.  Subjective:   HPI:   Patient presents via [facility] today c/o ongoing post-nasal drainage, nasal congestion and bilateral ear pressure over the past month with a few days now of increased ear pressure with pain, sinus pain, sore throat. Notes drainage is now thicker and green/brown. Notes tooth pain over the past few days as well. Recent dental checkup that was unremarkable. Denies fever, chills, chest congestion, cough, SOB. Denies recent travel or sick contact. Has been social distancing and no known exposure  to [place]. Denies GI symptoms. Denies change in taste or smell. Has been taking her Xyzal and Flonase..  ROS:   See pertinent positives and negatives per HPI.  Patient Active Problem List   Diagnosis Date Noted  . Family history of breast cancer in mother 01/13/2014  . Asthma, mild persistent   . History of iron deficiency anemia 06/19/2012  . GAD (generalized anxiety disorder) 11/21/2011  . Migraine headache without aura 11/02/2011    Social History   Tobacco Use  . Smoking status: Never Smoker  . Smokeless tobacco: Never Used  Substance Use Topics  . Alcohol use: No    Current Outpatient Medications:  .  albuterol (VENTOLIN HFA) 108 (90 Base) MCG/ACT inhaler, Inhale 2 puffs into the lungs every 4 (four) hours as needed for wheezing., Disp: 18 g, Rfl: 5 .  fluticasone (FLONASE) 50 MCG/ACT nasal spray, Place 2 sprays into both nostrils  daily., Disp: 16 g, Rfl: 6 .  levocetirizine (XYZAL) 5 MG tablet, Take 5 mg by mouth every evening., Disp: , Rfl:   No Known Allergies  Objective:   There were no vitals taken for this visit.  Patient is well-developed, well-nourished in no acute distress.  Resting comfortably at home.  Head is normocephalic, atraumatic.  No labored breathing.  Speech is clear and coherent with logical content.  Patient is alert and oriented at baseline.  + TTP maxillary sinuses on examination.   Assessment and Plan:   1. Acute non-recurrent maxillary sinusitis Rx Augmentin.  Increase fluids.  Rest.  Saline nasal spray.  Probiotic.  Mucinex as directed.  Humidifier in bedroom. Start Astelin.  Call or return to clinic if symptoms are not improving.  - amoxicillin-clavulanate (AUGMENTIN) 875-125 MG tablet; Take 1 tablet by mouth 2 (two) times daily.  Dispense: 14 tablet; Refill: 0 - azelastine (ASTELIN) 0.1 % nasal spray; Place 2 sprays into both nostrils 2 (two) times daily. Use in each nostril as directed  Dispense: 30 mL; Refill: 1 .   Leeanne Rio, PA-C 11/19/2018

## 2018-11-19 NOTE — Progress Notes (Signed)
I have discussed the procedure for the virtual visit with the patient who has given consent to proceed with assessment and treatment.   Nasire Reali S Shere Eisenhart, CMA     

## 2018-11-19 NOTE — Patient Instructions (Signed)
Instructions sent to MyChart.   Please take antibiotic as directed.  Increase fluid intake.  Use Saline nasal spray.  Take a daily multivitamin. Use the Astelin nasal spray instead of the Flonase for now. Continue the [place] a humidifier in the bedroom.  Please call or return clinic if symptoms are not improving.  Sinusitis Sinusitis is redness, soreness, and swelling (inflammation) of the paranasal sinuses. Paranasal sinuses are air pockets within the bones of your face (beneath the eyes, the middle of the forehead, or above the eyes). In healthy paranasal sinuses, mucus is able to drain out, and air is able to circulate through them by way of your nose. However, when your paranasal sinuses are inflamed, mucus and air can become trapped. This can allow bacteria and other germs to grow and cause infection. Sinusitis can develop quickly and last only a short time (acute) or continue over a long period (chronic). Sinusitis that lasts for more than 12 weeks is considered chronic.  CAUSES  Causes of sinusitis include:  Allergies.  Structural abnormalities, such as displacement of the cartilage that separates your nostrils (deviated septum), which can decrease the air flow through your nose and sinuses and affect sinus drainage.  Functional abnormalities, such as when the small hairs (cilia) that line your sinuses and help remove mucus do not work properly or are not present. SYMPTOMS  Symptoms of acute and chronic sinusitis are the same. The primary symptoms are pain and pressure around the affected sinuses. Other symptoms include:  Upper toothache.  Earache.  Headache.  Bad breath.  Decreased sense of smell and taste.  A cough, which worsens when you are lying flat.  Fatigue.  Fever.  Thick drainage from your nose, which often is green and may contain pus (purulent).  Swelling and warmth over the affected sinuses. DIAGNOSIS  Your caregiver will perform a physical exam.  During the exam, your caregiver may:  Look in your nose for signs of abnormal growths in your nostrils (nasal polyps).  Tap over the affected sinus to check for signs of infection.  View the inside of your sinuses (endoscopy) with a special imaging device with a light attached (endoscope), which is inserted into your sinuses. If your caregiver suspects that you have chronic sinusitis, one or more of the following tests may be recommended:  Allergy tests.  Nasal culture A sample of mucus is taken from your nose and sent to a lab and screened for bacteria.  Nasal cytology A sample of mucus is taken from your nose and examined by your caregiver to determine if your sinusitis is related to an allergy. TREATMENT  Most cases of acute sinusitis are related to a viral infection and will resolve on their own within 10 days. Sometimes medicines are prescribed to help relieve symptoms (pain medicine, decongestants, nasal steroid sprays, or saline sprays).  However, for sinusitis related to a bacterial infection, your caregiver will prescribe antibiotic medicines. These are medicines that will help kill the bacteria causing the infection.  Rarely, sinusitis is caused by a fungal infection. In theses cases, your caregiver will prescribe antifungal medicine. For some cases of chronic sinusitis, surgery is needed. Generally, these are cases in which sinusitis recurs more than 3 times per year, despite other treatments. HOME CARE INSTRUCTIONS   Drink plenty of water. Water helps thin the mucus so your sinuses can drain more easily.  Use a humidifier.  Inhale steam 3 to 4 times a day (for example, sit in the  bathroom with the shower running).  Apply a warm, moist washcloth to your face 3 to 4 times a day, or as directed by your caregiver.  Use saline nasal sprays to help moisten and clean your sinuses.  Take over-the-counter or prescription medicines for pain, discomfort, or fever only as directed by  your caregiver. SEEK IMMEDIATE MEDICAL CARE IF:  You have increasing pain or severe headaches.  You have nausea, vomiting, or drowsiness.  You have swelling around your face.  You have vision problems.  You have a stiff neck.  You have difficulty breathing. MAKE SURE YOU:   Understand these instructions.  Will watch your condition.  Will get help right away if you are not doing well or get worse. Document Released: 03/13/2005 Document Revised: 06/05/2011 Document Reviewed: 03/28/2011 [facility] Patient Information 2014 [place], [place].

## 2019-03-13 ENCOUNTER — Other Ambulatory Visit: Payer: Self-pay | Admitting: Emergency Medicine

## 2019-03-13 ENCOUNTER — Encounter: Payer: Self-pay | Admitting: Physician Assistant

## 2019-03-13 DIAGNOSIS — J4520 Mild intermittent asthma, uncomplicated: Secondary | ICD-10-CM

## 2019-03-13 MED ORDER — ALBUTEROL SULFATE HFA 108 (90 BASE) MCG/ACT IN AERS: 2.0000 | INHALATION_SPRAY | RESPIRATORY_TRACT | 5 refills | Status: DC | PRN

## 2019-03-17 DIAGNOSIS — D225 Melanocytic nevi of trunk: Secondary | ICD-10-CM | POA: Diagnosis not present

## 2019-03-17 DIAGNOSIS — L853 Xerosis cutis: Secondary | ICD-10-CM | POA: Diagnosis not present

## 2019-03-17 DIAGNOSIS — D2261 Melanocytic nevi of right upper limb, including shoulder: Secondary | ICD-10-CM | POA: Diagnosis not present

## 2019-03-17 DIAGNOSIS — D2262 Melanocytic nevi of left upper limb, including shoulder: Secondary | ICD-10-CM | POA: Diagnosis not present

## 2019-04-27 ENCOUNTER — Encounter: Payer: Self-pay | Admitting: Physician Assistant

## 2019-04-29 ENCOUNTER — Encounter: Payer: Self-pay | Admitting: Physician Assistant

## 2019-04-29 ENCOUNTER — Ambulatory Visit (INDEPENDENT_AMBULATORY_CARE_PROVIDER_SITE_OTHER): Payer: 59 | Admitting: Physician Assistant

## 2019-04-29 ENCOUNTER — Other Ambulatory Visit: Payer: Self-pay

## 2019-04-29 DIAGNOSIS — J0100 Acute maxillary sinusitis, unspecified: Secondary | ICD-10-CM | POA: Diagnosis not present

## 2019-04-29 MED ORDER — AMOXICILLIN-POT CLAVULANATE 875-125 MG PO TABS: 1.0000 | ORAL_TABLET | Freq: Two times a day (BID) | ORAL | 0 refills | Status: DC

## 2019-04-29 NOTE — Patient Instructions (Signed)
Instructions sent to MyChart.    Please take antibiotic as directed.  Increase fluid intake.  Use Saline nasal spray.  Take a daily multivitamin. Continue Zyrtec-D.  Place a humidifier in the bedroom.  Please call or return clinic if symptoms are not improving.  Sinusitis Sinusitis is redness, soreness, and swelling (inflammation) of the paranasal sinuses. Paranasal sinuses are air pockets within the bones of your face (beneath the eyes, the middle of the forehead, or above the eyes). In healthy paranasal sinuses, mucus is able to drain out, and air is able to circulate through them by way of your nose. However, when your paranasal sinuses are inflamed, mucus and air can become trapped. This can allow bacteria and other germs to grow and cause infection. Sinusitis can develop quickly and last only a short time (acute) or continue over a long period (chronic). Sinusitis that lasts for more than 12 weeks is considered chronic.  CAUSES  Causes of sinusitis include:  Allergies.  Structural abnormalities, such as displacement of the cartilage that separates your nostrils (deviated septum), which can decrease the air flow through your nose and sinuses and affect sinus drainage.  Functional abnormalities, such as when the small hairs (cilia) that line your sinuses and help remove mucus do not work properly or are not present. SYMPTOMS  Symptoms of acute and chronic sinusitis are the same. The primary symptoms are pain and pressure around the affected sinuses. Other symptoms include:  Upper toothache.  Earache.  Headache.  Bad breath.  Decreased sense of smell and taste.  A cough, which worsens when you are lying flat.  Fatigue.  Fever.  Thick drainage from your nose, which often is green and may contain pus (purulent).  Swelling and warmth over the affected sinuses. DIAGNOSIS  Your caregiver will perform a physical exam. During the exam, your caregiver may:  Look in your nose for  signs of abnormal growths in your nostrils (nasal polyps).  Tap over the affected sinus to check for signs of infection.  View the inside of your sinuses (endoscopy) with a special imaging device with a light attached (endoscope), which is inserted into your sinuses. If your caregiver suspects that you have chronic sinusitis, one or more of the following tests may be recommended:  Allergy tests.  Nasal culture A sample of mucus is taken from your nose and sent to a lab and screened for bacteria.  Nasal cytology A sample of mucus is taken from your nose and examined by your caregiver to determine if your sinusitis is related to an allergy. TREATMENT  Most cases of acute sinusitis are related to a viral infection and will resolve on their own within 10 days. Sometimes medicines are prescribed to help relieve symptoms (pain medicine, decongestants, nasal steroid sprays, or saline sprays).  However, for sinusitis related to a bacterial infection, your caregiver will prescribe antibiotic medicines. These are medicines that will help kill the bacteria causing the infection.  Rarely, sinusitis is caused by a fungal infection. In theses cases, your caregiver will prescribe antifungal medicine. For some cases of chronic sinusitis, surgery is needed. Generally, these are cases in which sinusitis recurs more than 3 times per year, despite other treatments. HOME CARE INSTRUCTIONS   Drink plenty of water. Water helps thin the mucus so your sinuses can drain more easily.  Use a humidifier.  Inhale steam 3 to 4 times a day (for example, sit in the bathroom with the shower running).  Apply a warm, moist washcloth  to your face 3 to 4 times a day, or as directed by your caregiver.  Use saline nasal sprays to help moisten and clean your sinuses.  Take over-the-counter or prescription medicines for pain, discomfort, or fever only as directed by your caregiver. SEEK IMMEDIATE MEDICAL CARE IF:  You have  increasing pain or severe headaches.  You have nausea, vomiting, or drowsiness.  You have swelling around your face.  You have vision problems.  You have a stiff neck.  You have difficulty breathing. MAKE SURE YOU:   Understand these instructions.  Will watch your condition.  Will get help right away if you are not doing well or get worse. Document Released: 03/13/2005 Document Revised: 06/05/2011 Document Reviewed: 03/28/2011 [facility] Patient Information 2014 [place], [place].

## 2019-04-29 NOTE — Telephone Encounter (Signed)
Patient would need video visit.

## 2019-04-29 NOTE — Progress Notes (Signed)
   Virtual Visit via Video   I connected with patient on 04/29/19 at  9:00 AM EST by a video enabled telemedicine application and verified that I am speaking with the correct person using two identifiers.  Location patient: Home Location provider: [facility], [facility] Persons participating in the virtual visit: Patient, Provider, CMA (Patina Moore)  I discussed the limitations of evaluation and management by telemedicine and the availability of in person appointments. The patient expressed understanding and agreed to proceed.  Subjective:   HPI:   Patient presents via [facility] today with sinus symptoms. Notes initially having a sinus headache x 5 days with nasal congestion -- frontal sinus pain and tenderness. Now with 2 days of ear pressure, fullness and aching. Now with facial pain as well. Denies fever, chills, loss of taste/smell, chest congestion or cough. Denies recent travel or sick contact. Is prone to sinusitis and otitis media.  Has taken Flonase and Zyrtec-D  ROS:   See pertinent positives and negatives per HPI.  Patient Active Problem List   Diagnosis Date Noted  . Family history of breast cancer in mother 01/13/2014  . Asthma, mild persistent   . History of iron deficiency anemia 06/19/2012  . GAD (generalized anxiety disorder) 11/21/2011  . Migraine headache without aura 11/02/2011    Social History   Tobacco Use  . Smoking status: Never Smoker  . Smokeless tobacco: Never Used  Substance Use Topics  . Alcohol use: No    Current Outpatient Medications:  .  albuterol (VENTOLIN HFA) 108 (90 Base) MCG/ACT inhaler, Inhale 2 puffs into the lungs every 4 (four) hours as needed for wheezing., Disp: 18 g, Rfl: 5 .  cetirizine-pseudoephedrine (ZYRTEC-D) 5-120 MG tablet, Take 1 tablet by mouth 2 (two) times daily., Disp: , Rfl:  .  fluticasone (FLONASE) 50 MCG/ACT nasal spray, Place 2 sprays into both nostrils daily., Disp: 16 g, Rfl: 6  No Known  Allergies  Objective:   There were no vitals taken for this visit.  Patient is well-developed, well-nourished in no acute distress.  Resting comfortably at home.  Head is normocephalic, atraumatic.  No labored breathing.  Speech is clear and coherent with logical content.  Patient is alert and oriented at baseline.  + TTP sinuses bilaterally.   Assessment and Plan:   1. Acute non-recurrent sinusitis Rx Augmentin.  Increase fluids.  Rest.  Saline nasal spray.  Probiotic.  Mucinex as directed.  Humidifier in bedroom. Continue Zyrtec-D.Marland Kitchen  Call or return to clinic if symptoms are not improving.  - amoxicillin-clavulanate (AUGMENTIN) 875-125 MG tablet; Take 1 tablet by mouth 2 (two) times daily.  Dispense: 14 tablet; Refill: 0    Piedad Climes, [place] 04/29/2019

## 2019-04-29 NOTE — Progress Notes (Signed)
I have discussed the procedure for the virtual visit with the patient who has given consent to proceed with assessment and treatment.   Janet Hall S Shonia Skilling, CMA     

## 2019-05-20 ENCOUNTER — Encounter: Payer: Self-pay | Admitting: Physician Assistant

## 2019-05-23 ENCOUNTER — Encounter: Payer: Self-pay | Admitting: Physician Assistant

## 2019-05-23 ENCOUNTER — Ambulatory Visit (INDEPENDENT_AMBULATORY_CARE_PROVIDER_SITE_OTHER): Payer: 59 | Admitting: Physician Assistant

## 2019-05-23 ENCOUNTER — Other Ambulatory Visit: Payer: Self-pay

## 2019-05-23 VITALS — BP 102/80 | HR 91 | Temp 98.6°F | Resp 16 | Ht 70.0 in | Wt 127.0 lb

## 2019-05-23 DIAGNOSIS — K602 Anal fissure, unspecified: Secondary | ICD-10-CM | POA: Diagnosis not present

## 2019-05-23 MED ORDER — PRAMOXINE HCL (PERIANAL) 1 % EX FOAM: 1.0000 "application " | Freq: Three times a day (TID) | CUTANEOUS | 0 refills | Status: DC | PRN

## 2019-05-23 NOTE — Progress Notes (Signed)
Patient presents to clinic today for assessment of a potential anal fissure.  Patient endorses intermittent issues with constipation over the Past couple of months.  Notes she does not eat a very well-balanced diet or keep hydrated.  Has had 2 weeks of rectal pain with bowel movement associated with some scant blood noted on wiping.  Denies melena.  Denies any abdominal pain, nausea or vomiting.  States she has started keeping very well-hydrated and eating a better diet with resolution of her constipation.  Notes she has been applying an OTC anal fissure oil to the area with some improvement.   Past Medical History:  Diagnosis Date  . Asthma, mild persistent   . Family history of breast cancer in mother    Per oncology, pt should start mammograms at age 68.  Marland Kitchen GAD (generalized anxiety disorder) 11/21/2011  . Migraine without aura    Onset in early teens.  Anywhere from 2 per week to 1 per month.      Current Outpatient Medications on File Prior to Visit  Medication Sig Dispense Refill  . albuterol (VENTOLIN HFA) 108 (90 Base) MCG/ACT inhaler Inhale 2 puffs into the lungs every 4 (four) hours as needed for wheezing. 18 g 5  . cetirizine-pseudoephedrine (ZYRTEC-D) 5-120 MG tablet Take 1 tablet by mouth 2 (two) times daily.    . fluticasone (FLONASE) 50 MCG/ACT nasal spray Place 2 sprays into both nostrils daily. 16 g 6   No current facility-administered medications on file prior to visit.    No Known Allergies  Family History  Problem Relation Age of Onset  . Arthritis Mother   . Cancer Mother 56       breast.  Died of metastatic breast cancer 2017.  Marland Kitchen Hypertension Father   . Arthritis Maternal Grandmother        liver cancer  . Arthritis Maternal Grandfather        stomach cancer  . Diabetes Paternal Grandmother   . Heart attack Paternal Grandmother   . Other Paternal Grandfather        vertigo    Social History   Socioeconomic History  . Marital status: Married    Spouse  name: Not on file  . Number of children: Not on file  . Years of education: Not on file  . Highest education level: Not on file  Occupational History  . Not on file  Tobacco Use  . Smoking status: Never Smoker  . Smokeless tobacco: Never Used  Substance and Sexual Activity  . Alcohol use: No  . Drug use: No  . Sexual activity: Yes    Birth control/protection: None  Other Topics Concern  . Not on file  Social History Narrative   Married, 1 child.   Educ: HS at [employer].   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   No T/A/Ds.   Intermittent exercising.   Her mom passed away from metastatic breast cancer, summer 2017.   Social Determinants of Health   Financial Resource Strain:   . Difficulty of Paying Living Expenses: Not on file  Food Insecurity:   . Worried About Programme researcher, broadcasting/film/video in the Last Year: Not on file  . Ran Out of Food in the Last Year: Not on file  Transportation Needs:   . Lack of Transportation (Medical): Not on file  . Lack of Transportation (Non-Medical): Not on file  Physical Activity:   . Days of Exercise per Week: Not on file  .  Minutes of Exercise per Session: Not on file  Stress:   . Feeling of Stress : Not on file  Social Connections:   . Frequency of Communication with Friends and Family: Not on file  . Frequency of Social Gatherings with Friends and Family: Not on file  . Attends Religious Services: Not on file  . Active Member of Clubs or Organizations: Not on file  . Attends Archivist Meetings: Not on file  . Marital Status: Not on file   Review of Systems - See HPI.  All other ROS are negative.  BP 102/80   Pulse 91   Temp 98.6 F (37 C) (Temporal)   Resp 16   Ht 5\' 10"  (1.778 m)   Wt 127 lb (57.6 kg)   SpO2 99%   BMI 18.22 kg/m   Physical Exam Vitals reviewed. Exam conducted with a chaperone present.  Constitutional:      Appearance: Normal appearance.  Cardiovascular:     Rate and Rhythm: Normal rate  and regular rhythm.     Heart sounds: Normal heart sounds.  Genitourinary:    Rectum: Tenderness and anal fissure (1 cm vertical fissure with small sentinel skin tag noted) present. No mass, external hemorrhoid or internal hemorrhoid. Normal anal tone.  Neurological:     Mental Status: She is alert.     Assessment/Plan: 1. Anal fissure With sentinel skin tag.  Is healing.  Patient to continue good fiber intake and increase fluids.  Bowel regimen reviewed.  Sits baths recommended to help promote cleansing and healing.  Can continue her OTC fish oil.  Discussed topical medications.  Will start with Proctofoam for now as topical nitrates/CCB have higher side effect profile.  Follow-up in 7 to 10 days via MyChart for reassessment.  If not continuing to heal, will refer her to colorectal surgery.  This visit occurred during the SARS-CoV-2 public health emergency.  Safety protocols were in place, including screening questions prior to the visit, additional usage of staff PPE, and extensive cleaning of exam room while observing appropriate contact time as indicated for disinfecting solutions.     Leeanne Rio, PA-C

## 2019-05-23 NOTE — Patient Instructions (Addendum)
Please keep well hydrated. Keep good intake of fiber.  Apply the pramoxine to the area to help with numbing the area to help with pain. Can apply the fissure oil after applying the prescription medication. Start the sitz baths with warm water after each BM and once nightly.   Follow-up with me in 7-10 days to let me know how things are going. If not improving, will need colorectal consult.    Anal Fissure, Adult  An anal fissure is a small tear or crack in the tissue around the opening of the butt (anus). Bleeding from the tear or crack usually stops on its own within a few minutes. The bleeding may happen every time you poop (have a bowel movement) until the tear or crack heals. What are the causes? This condition is usually caused by passing a large or hard poop (stool). Other causes include:  Trouble pooping (constipation).  Passing watery poop (diarrhea).  Inflammatory bowel disease (Crohn's disease or ulcerative colitis).  Childbirth.  Infections.  Anal sex. What are the signs or symptoms? Symptoms of this condition include:  Bleeding from the butt.  Small amounts of blood on your poop. The blood coats the outside of the poop. It is not mixed with the poop.  Small amounts of blood on the toilet paper or in the toilet after you poop.  Pain when passing poop.  Itching or irritation around the opening of the butt. How is this diagnosed? This condition may be diagnosed based on a physical exam. Your doctor may:  Check your butt. A tear can often be seen by checking the area with care.  Check your butt using a short tube (anoscope). The light in the tube will show any problems in your butt. How is this treated? Treatment for this condition may include:  Treating problems that make it hard for you to pass poop. You may be told to: ? Eat more fiber. ? Drink more fluid. ? Take fiber supplements. ? Take medicines that make poop soft.  Taking sitz baths. This may help  to heal the tear.  Using creams and ointments. If your condition gets worse, other treatments may be needed such as:  A shot near the tear or crack (botulinum injection).  Surgery to repair the tear or crack. Follow these instructions at home: Eating and drinking   Avoid bananas and dairy products. These foods can make it hard to poop.  Drink enough fluid to keep your pee (urine) pale yellow.  Eat foods that have a lot of fiber in them, such as: ? Beans. ? Whole grains. ? Fresh fruits. ? Fresh vegetables. General instructions   Take over-the-counter and prescription medicines only as told by your doctor.  Use creams or ointments only as told by your doctor.  Keep the butt area as clean and dry as you can.  Take a warm water bath (sitz bath) as told by your doctor. Do not use soap.  Keep all follow-up visits as told by your doctor. This is important. Contact a doctor if:  You have more bleeding.  You have a fever.  You have watery poop that is mixed with blood.  You have pain.  Your problem gets worse, not better. Summary  An anal fissure is a small tear or crack in the skin around the opening of the butt (anus).  This condition is usually caused by passing a large or hard poop (stool).  Treatment includes treating the problems that make it hard for  you to pass poop.  Follow your doctor's instructions about caring for your condition at home.  Keep all follow-up visits as told by your doctor. This is important. This information is not intended to replace advice given to you by your health care provider. Make sure you discuss any questions you have with your health care provider. Document Revised: 08/23/2017 Document Reviewed: 08/23/2017 Elsevier Patient Education  2020 [employer].

## 2019-07-08 ENCOUNTER — Encounter: Payer: Self-pay | Admitting: Physician Assistant

## 2019-07-09 ENCOUNTER — Encounter: Payer: Self-pay | Admitting: Physician Assistant

## 2019-07-09 NOTE — Telephone Encounter (Signed)
I believe we are full today now especially due to meetings. I do have opening tomorrow. Please help get patient scheduled. Thank you.

## 2019-07-10 ENCOUNTER — Encounter: Payer: Self-pay | Admitting: Physician Assistant

## 2019-07-10 ENCOUNTER — Other Ambulatory Visit: Payer: Self-pay

## 2019-07-10 ENCOUNTER — Telehealth (INDEPENDENT_AMBULATORY_CARE_PROVIDER_SITE_OTHER): Payer: 59 | Admitting: Physician Assistant

## 2019-07-10 DIAGNOSIS — B9689 Other specified bacterial agents as the cause of diseases classified elsewhere: Secondary | ICD-10-CM | POA: Diagnosis not present

## 2019-07-10 DIAGNOSIS — J019 Acute sinusitis, unspecified: Secondary | ICD-10-CM

## 2019-07-10 MED ORDER — PREDNISONE 10 MG PO TABS: ORAL_TABLET | ORAL | 0 refills | Status: AC

## 2019-07-10 MED ORDER — DOXYCYCLINE HYCLATE 100 MG PO TABS: 100.0000 mg | ORAL_TABLET | Freq: Two times a day (BID) | ORAL | 0 refills | Status: DC

## 2019-07-10 NOTE — Progress Notes (Signed)
I have discussed the procedure for the virtual visit with the patient who has given consent to proceed with assessment and treatment.   Ricci Dirocco S Cato Liburd, CMA     

## 2019-07-10 NOTE — Progress Notes (Signed)
   Virtual Visit via Video   I connected with patient on 07/10/19 at 10:00 AM EDT by a video enabled telemedicine application and verified that I am speaking with the correct person using two identifiers.  Location patient: Home Location provider: [facility], [facility] Persons participating in the virtual visit: Patient, Provider, CMA (Patina Moore)  I discussed the limitations of evaluation and management by telemedicine and the availability of in person appointments. The patient expressed understanding and agreed to proceed.  Subjective:   HPI:   Patient presents via [facility] today complaining of greater than 1 week of nasal congestion, sinus pressure, ear pressure and pain and fatigue.  Now with sinus headache, facial pain and mild chest congestion.  Denies fever or chills.  Denies shortness of breath or chest pain.  Notes nasal drainage has become very thick despite use of her over-the-counter medicines.  Has been taking daily Zyrtec or Zyrtec-D along with Flonase and her Singulair.  Has also been using saline nasal spray with only minimal relief of symptoms.  Denies recent travel or sick contact.  Feels this was initially triggered by her allergies but now has changed to something else.  ROS:   See pertinent positives and negatives per HPI.  Patient Active Problem List   Diagnosis Date Noted  . Family history of breast cancer in mother 01/13/2014  . Asthma, mild persistent   . History of iron deficiency anemia 06/19/2012  . GAD (generalized anxiety disorder) 11/21/2011  . Migraine headache without aura 11/02/2011    Social History   Tobacco Use  . Smoking status: Never Smoker  . Smokeless tobacco: Never Used  Substance Use Topics  . Alcohol use: No    Current Outpatient Medications:  .  albuterol (VENTOLIN HFA) 108 (90 Base) MCG/ACT inhaler, Inhale 2 puffs into the lungs every 4 (four) hours as needed for wheezing., Disp: 18 g, Rfl: 5 .  cetirizine (ZYRTEC) 10 MG  tablet, Take 10 mg by mouth daily., Disp: , Rfl:  .  fluticasone (FLONASE) 50 MCG/ACT nasal spray, Place 2 sprays into both nostrils daily., Disp: 16 g, Rfl: 6  No Known Allergies  Objective:   There were no vitals taken for this visit.  Patient is well-developed, well-nourished in no acute distress.  Resting comfortably at home.  Head is normocephalic, atraumatic.  No labored breathing.  Speech is clear and coherent with logical content.  Patient is alert and oriented at baseline.   Assessment and Plan:   1. Acute bacterial sinusitis Rx Doxycycline.  Increase fluids.  Rest.  Saline nasal spray.  Probiotic.  Mucinex as directed.  Humidifier in bedroom. Start Steroid taper due to significant ETD and sinus discomfort.  We will have her continue her routine allergy medicines.  Call or return to clinic if symptoms are not improving.  - doxycycline (VIBRA-TABS) 100 MG tablet; Take 1 tablet (100 mg total) by mouth 2 (two) times daily.  Dispense: 20 tablet; Refill: 0 - predniSONE (DELTASONE) 10 MG tablet; Take 3 tablets (30 mg total) by mouth daily with breakfast for 3 days, THEN 2 tablets (20 mg total) daily with breakfast for 3 days, THEN 1 tablet (10 mg total) daily with breakfast for 3 days.  Dispense: 18 tablet; Refill: 0    Piedad Climes, [place] 07/10/2019

## 2019-07-10 NOTE — Patient Instructions (Signed)
Instructions sent to MyChart

## 2019-07-11 ENCOUNTER — Telehealth: Payer: Self-pay | Admitting: Physician Assistant

## 2019-07-11 NOTE — Telephone Encounter (Signed)
Spoke with patient and she took the Prednisone 3 tabs and Doxycycline. She had a headache over night and fell asleep with a rice pack. This morning had some numbness and tingling of the left side of the face. She has already taken the Prednisone 3 tabs today. Sensation feels the same on the other side of face.  She did read the package insert.  She started worrying. She denies sob, chest pains, arm pain or numbness. Please advise

## 2019-07-11 NOTE — Telephone Encounter (Signed)
Pt called stating she began taking Doxycycline & Prednisone yesterday. Pt states she is experiencing a numbness and tingling to the left side of her face. Pt would like nurse to call her back and advise her on what she should do. Please advise.

## 2019-07-11 NOTE — Telephone Encounter (Signed)
Advised patient of PCP recommendations. She states the area is improving some. Advised if symptoms reoccur or worsen to call the office. She is agreeable.

## 2019-07-11 NOTE — Telephone Encounter (Signed)
If symptoms resolving would not be concerned. Likely where she irritated cutaneous nerves by sleeping with pack against her face last night. If there is any recurrence she is to let us know.

## 2019-07-12 ENCOUNTER — Encounter: Payer: Self-pay | Admitting: Physician Assistant

## 2019-07-14 MED ORDER — AMOXICILLIN-POT CLAVULANATE 875-125 MG PO TABS: 1.0000 | ORAL_TABLET | Freq: Two times a day (BID) | ORAL | 0 refills | Status: DC

## 2019-09-22 ENCOUNTER — Encounter: Payer: Self-pay | Admitting: Physician Assistant

## 2019-10-17 ENCOUNTER — Encounter: Payer: Self-pay | Admitting: Physician Assistant

## 2019-11-21 ENCOUNTER — Encounter: Payer: Self-pay | Admitting: Physician Assistant

## 2019-12-11 ENCOUNTER — Other Ambulatory Visit: Payer: Self-pay | Admitting: Emergency Medicine

## 2019-12-11 MED ORDER — FLUTICASONE PROPIONATE 50 MCG/ACT NA SUSP: 2.0000 | Freq: Every day | NASAL | 6 refills | Status: DC

## 2020-03-12 ENCOUNTER — Telehealth (INDEPENDENT_AMBULATORY_CARE_PROVIDER_SITE_OTHER): Payer: 59 | Admitting: Physician Assistant

## 2020-03-12 ENCOUNTER — Encounter: Payer: Self-pay | Admitting: Physician Assistant

## 2020-03-12 DIAGNOSIS — Z20822 Contact with and (suspected) exposure to covid-19: Secondary | ICD-10-CM | POA: Diagnosis not present

## 2020-03-12 DIAGNOSIS — J0100 Acute maxillary sinusitis, unspecified: Secondary | ICD-10-CM

## 2020-03-12 MED ORDER — AMOXICILLIN 500 MG PO CAPS: 500.0000 mg | ORAL_CAPSULE | Freq: Three times a day (TID) | ORAL | 0 refills | Status: DC

## 2020-03-12 NOTE — Progress Notes (Signed)
   Virtual Visit via Video   I connected with patient on 03/12/20 at 11:00 AM EST by a video enabled telemedicine application and verified that I am speaking with the correct person using two identifiers.  Location patient: Home Location provider: [facility], [facility] Persons participating in the virtual visit: Patient, Provider, CMA (Patina Moore)  I discussed the limitations of evaluation and management by telemedicine and the availability of in person appointments. The patient expressed understanding and agreed to proceed.  Subjective:   HPI:   Patient presents via [facility] today c/o 1.5 weeks of ear pressure, sinus pressure, sinus pain now with facial pain and bilateral ear pain. Denies any chest congestion, chest pain. Denies loss of taste or smell. Does note husband diagnosed with COVID one week ago. Thinks maybe she has mild COVID and has been quarantining but has not tested.   ROS:   See pertinent positives and negatives per HPI.  Patient Active Problem List   Diagnosis Date Noted  . Family history of breast cancer in mother 01/13/2014  . Asthma, mild persistent   . History of iron deficiency anemia 06/19/2012  . GAD (generalized anxiety disorder) 11/21/2011  . Migraine headache without aura 11/02/2011    Social History   Tobacco Use  . Smoking status: Never Smoker  . Smokeless tobacco: Never Used  Substance Use Topics  . Alcohol use: No    Current Outpatient Medications:  .  albuterol (VENTOLIN HFA) 108 (90 Base) MCG/ACT inhaler, Inhale 2 puffs into the lungs every 4 (four) hours as needed for wheezing., Disp: 18 g, Rfl: 5 .  amoxicillin-clavulanate (AUGMENTIN) 875-125 MG tablet, Take 1 tablet by mouth 2 (two) times daily., Disp: 14 tablet, Rfl: 0 .  cetirizine (ZYRTEC) 10 MG tablet, Take 10 mg by mouth daily., Disp: , Rfl:  .  fluticasone (FLONASE) 50 MCG/ACT nasal spray, Place 2 sprays into both nostrils daily., Disp: 16 g, Rfl: 6  No Known  Allergies  Objective:   There were no vitals taken for this visit.  Patient is well-developed, well-nourished in no acute distress.  Resting comfortably at home.  Head is normocephalic, atraumatic.  No labored breathing.  Speech is clear and coherent with logical content.  Patient is alert and oriented at baseline.  + TTP maxillary sinuses  Assessment and Plan:   1. Suspected COVID-19 virus infection 2. Acute non-recurrent maxillary sinusitis Rx Augmentin.  Increase fluids.  Rest.  Saline nasal spray.  Probiotic.  Mucinex as directed.  Humidifier in bedroom. Start Vitamin D, Vitamin C and zinc. Doses reviewed. Continue quarantine. If anything worsening she needs true testing and in-person evaluation. Patient enrolled in monitoring program. Strict ER precautions discussed with patient.  Call or return to clinic if symptoms are not improving.  Piedad Climes, PA-C 03/12/2020

## 2020-03-12 NOTE — Patient Instructions (Signed)
Instructions sent to patients MyChart.

## 2020-05-19 ENCOUNTER — Telehealth: Payer: Self-pay | Admitting: Physician Assistant

## 2020-05-19 DIAGNOSIS — J4520 Mild intermittent asthma, uncomplicated: Secondary | ICD-10-CM

## 2020-05-19 MED ORDER — ALBUTEROL SULFATE HFA 108 (90 BASE) MCG/ACT IN AERS: 2.0000 | INHALATION_SPRAY | RESPIRATORY_TRACT | 5 refills | Status: AC | PRN

## 2020-05-19 NOTE — Telephone Encounter (Signed)
Rx sent to the preferred patient pharmacy  

## 2020-05-19 NOTE — Telephone Encounter (Signed)
Patient called and needs a refill on her albuterol inhaler - please send to [facility] in [place].

## 2020-06-23 ENCOUNTER — Other Ambulatory Visit: Payer: 59 | Admitting: Women's Health

## 2020-07-13 ENCOUNTER — Other Ambulatory Visit: Payer: Self-pay

## 2020-07-13 ENCOUNTER — Encounter: Payer: Self-pay | Admitting: Women's Health

## 2020-07-13 ENCOUNTER — Ambulatory Visit (INDEPENDENT_AMBULATORY_CARE_PROVIDER_SITE_OTHER): Payer: 59 | Admitting: Women's Health

## 2020-07-13 ENCOUNTER — Other Ambulatory Visit (HOSPITAL_COMMUNITY)
Admission: RE | Admit: 2020-07-13 | Discharge: 2020-07-13 | Disposition: A | Discharge status: Home / Self Care | Payer: 59 | Source: Ambulatory Visit | Attending: Obstetrics & Gynecology | Admitting: Obstetrics & Gynecology

## 2020-07-13 VITALS — BP 135/87 | HR 109 | Ht 69.0 in | Wt 127.0 lb

## 2020-07-13 DIAGNOSIS — Z01419 Encounter for gynecological examination (general) (routine) without abnormal findings: Secondary | ICD-10-CM | POA: Diagnosis not present

## 2020-07-13 DIAGNOSIS — Z803 Family history of malignant neoplasm of breast: Secondary | ICD-10-CM

## 2020-07-13 DIAGNOSIS — N6320 Unspecified lump in the left breast, unspecified quadrant: Secondary | ICD-10-CM

## 2020-07-13 NOTE — Progress Notes (Signed)
WELL-WOMAN EXAMINATION Patient name: Janet Hall MRN [id number]  Date of birth: 10/19/1985 Chief Complaint:   Gynecologic Exam  History of Present Illness:   Janet Hall is a 35 y.o. G91P1001 Caucasian female being seen today for a routine well-woman exam.  Current complaints: none  Depression screen [facility] 2/9 07/13/2020 04/29/2019 12/03/2017 01/04/2017 07/25/2016  Decreased Interest 0 0 0 0 0  Down, Depressed, Hopeless 0 0 0 0 0  PHQ - 2 Score 0 0 0 0 0  Altered sleeping 0 - 0 - -  Tired, decreased energy 0 - 0 - -  Change in appetite 0 - 0 - -  Feeling bad or failure about yourself  0 - 0 - -  Trouble concentrating 0 - 0 - -  Moving slowly or fidgety/restless 0 - 0 - -  Suicidal thoughts 0 - 0 - -  PHQ-9 Score 0 - 0 - -  Difficult doing work/chores - - Not difficult at all - -     PCP: [facility] in [place]      does not desire labs Patient's last menstrual period was 06/21/2020 (exact date). The current method of family planning is condoms.  Last pap 2018. Results were: NILM w/ HRHPV negative. H/O abnormal pap: no Last mammogram: never, has had breast u/s in past for mass in Lt breast, feels like she went twice to [place], was normal. Reports mass still there, comes and goes w/ menstrual cycle. Results were: normal. Family h/o breast cancer: yes mom dx @ 59yo, mom's niece (pt's cousin) dx @ 44yo Last colonoscopy: never. Results were: N/A. Family h/o colorectal cancer: no Review of Systems:   Pertinent items are noted in HPI Denies any headaches, blurred vision, fatigue, shortness of breath, chest pain, abdominal pain, abnormal vaginal discharge/itching/odor/irritation, problems with periods, bowel movements, urination, or intercourse unless otherwise stated above. Pertinent History Reviewed:  Reviewed past medical,surgical, social and family history.  Reviewed problem list, medications and allergies. Physical Assessment:   Vitals:   07/13/20 1336  BP: 135/87  Pulse: (!)  109  Weight: 127 lb (57.6 kg)  Height: 5\' 9"  (1.753 m)  Body mass index is 18.75 kg/m.        Physical Examination:   General appearance - well appearing, and in no distress  Mental status - alert, oriented to person, place, and time  Psych:  She has a normal mood and affect  Skin - warm and dry, normal color, no suspicious lesions noted  Chest - effort normal, all lung fields clear to auscultation bilaterally  Heart - normal rate and regular rhythm  Neck:  midline trachea, no thyromegaly or nodules  Breasts - Rt: breasts appear normal, no suspicious masses, no skin or nipple changes or axillary nodes, Lt: firm non-tender mass at 3 o'clock 58fb from nipple, no axillary nodes or skin changes  Abdomen - soft, nontender, nondistended, no masses or organomegaly  Pelvic - VULVA: normal appearing vulva with no masses, tenderness or lesions  VAGINA: normal appearing vagina with normal color and discharge, no lesions  CERVIX: normal appearing cervix without discharge or lesions, no CMT  Thin prep pap is done w/ HR HPV cotesting  UTERUS: uterus is felt to be normal size, shape, consistency and nontender   ADNEXA: No adnexal masses or tenderness noted.  Extremities:  No swelling or varicosities noted  Chaperone: 4f    No results found for this or any previous visit (from the past 24 hour(s)).  Assessment & Plan:  1) Well-Woman Exam  2) Lt breast mass w/ family h/o breast cancer> will get diagnostic mammo and u/s at [facility] (where pt had [facility] u/s), order faxed  Labs/procedures today: pap  Mammogram: diagnostic mammo and breast u/s ordered, or sooner if problems Colonoscopy: @ 35yo, or sooner if problems  No orders of the defined types were placed in this encounter.   Meds: No orders of the defined types were placed in this encounter.   Follow-up: Return in about 1 year (around 07/13/2021) for Physical.  Cheral Marker CNM, [facility] 07/13/2020 2:38 PM

## 2020-07-16 LAB — CYTOLOGY - PAP
Chlamydia: NEGATIVE
Comment: NEGATIVE
Comment: NEGATIVE
Comment: NORMAL
Diagnosis: NEGATIVE
High risk HPV: NEGATIVE
Neisseria Gonorrhea: NEGATIVE

## 2020-07-19 ENCOUNTER — Encounter: Payer: Self-pay | Admitting: Women's Health

## 2020-11-08 ENCOUNTER — Encounter: Payer: Self-pay | Admitting: Nurse Practitioner

## 2020-11-08 ENCOUNTER — Telehealth (INDEPENDENT_AMBULATORY_CARE_PROVIDER_SITE_OTHER): Payer: 59 | Admitting: Nurse Practitioner

## 2020-11-08 VITALS — Temp 97.9°F | Ht 69.0 in | Wt 127.0 lb

## 2020-11-08 DIAGNOSIS — J0101 Acute recurrent maxillary sinusitis: Secondary | ICD-10-CM

## 2020-11-08 DIAGNOSIS — H6983 Other specified disorders of Eustachian tube, bilateral: Secondary | ICD-10-CM | POA: Diagnosis not present

## 2020-11-08 MED ORDER — LEVOCETIRIZINE DIHYDROCHLORIDE 5 MG PO TABS
5.0000 mg | ORAL_TABLET | Freq: Every evening | ORAL | Status: DC
Start: 2020-11-08 — End: 2022-10-04

## 2020-11-08 MED ORDER — AMOXICILLIN 875 MG PO TABS: 875.0000 mg | ORAL_TABLET | Freq: Two times a day (BID) | ORAL | 0 refills | Status: AC

## 2020-11-08 MED ORDER — FLUTICASONE PROPIONATE 50 MCG/ACT NA SUSP: 2.0000 | Freq: Every day | NASAL | 0 refills | Status: DC

## 2020-11-08 NOTE — Progress Notes (Signed)
Virtual Visit via Video Note  I connected withNAME@ on 11/08/20 at 10:30 AM EDT by a video enabled telemedicine application and verified that I am speaking with the correct person using two identifiers.  Location: Patient:Home Provider: Office Participants: patient and provider  I discussed the limitations of evaluation and management by telemedicine and the availability of in person appointments. I also discussed with the patient that there may be a patient responsible charge related to this service. The patient expressed understanding and agreed to proceed.  TL:XBWIO congestion and ear pain x 2weeks  History of Present Illness: Sinusitis This is a new problem. The current episode started 1 to 4 weeks ago. The problem has been gradually worsening since onset. There has been no fever. The pain is moderate. Associated symptoms include congestion, ear pain, headaches and sinus pressure. Pertinent negatives include no chills, coughing, diaphoresis, hoarse voice, neck pain, sneezing, sore throat or swollen glands. Past treatments include saline sprays (flonase and zyrtec).  No oral abx used in last 31months. Inflammed maxillary sinus per dentist   Observations/Objective: Physical Exam Pulmonary:     Effort: Pulmonary effort is normal.  Musculoskeletal:     Cervical back: Normal range of motion and neck supple.  Neurological:     Mental Status: She is alert and oriented to person, place, and time.   Assessment and Plan: Khylee was seen today for acute visit.  Diagnoses and all orders for this visit:  Acute recurrent maxillary sinusitis -     amoxicillin (AMOXIL) 875 MG tablet; Take 1 tablet (875 mg total) by mouth 2 (two) times daily for 7 days. -     fluticasone (FLONASE) 50 MCG/ACT nasal spray; Place 2 sprays into both nostrils daily. -     levocetirizine (XYZAL) 5 MG tablet; Take 1 tablet (5 mg total) by mouth every evening.  Eustachian tube dysfunction, bilateral -      amoxicillin (AMOXIL) 875 MG tablet; Take 1 tablet (875 mg total) by mouth 2 (two) times daily for 7 days. -     fluticasone (FLONASE) 50 MCG/ACT nasal spray; Place 2 sprays into both nostrils daily. -     levocetirizine (XYZAL) 5 MG tablet; Take 1 tablet (5 mg total) by mouth every evening.   Follow Up Instructions: F/up with pcp if no improvement in 1week.   I discussed the assessment and treatment plan with the patient. The patient was provided an opportunity to ask questions and all were answered. The patient agreed with the plan and demonstrated an understanding of the instructions.   The patient was advised to call back or seek an in-person evaluation if the symptoms worsen or if the condition fails to improve as anticipated.  Alysia Penna, NP

## 2021-03-25 ENCOUNTER — Telehealth: Payer: 59 | Admitting: Registered Nurse

## 2021-06-06 DIAGNOSIS — T691XXA Chilblains, initial encounter: Secondary | ICD-10-CM | POA: Diagnosis not present

## 2021-06-06 DIAGNOSIS — Z79899 Other long term (current) drug therapy: Secondary | ICD-10-CM | POA: Diagnosis not present

## 2021-06-27 ENCOUNTER — Other Ambulatory Visit: Payer: Self-pay | Admitting: Surgery

## 2021-07-06 DIAGNOSIS — J0100 Acute maxillary sinusitis, unspecified: Secondary | ICD-10-CM | POA: Diagnosis not present

## 2021-08-16 DIAGNOSIS — D2262 Melanocytic nevi of left upper limb, including shoulder: Secondary | ICD-10-CM | POA: Diagnosis not present

## 2021-08-16 DIAGNOSIS — D224 Melanocytic nevi of scalp and neck: Secondary | ICD-10-CM | POA: Diagnosis not present

## 2021-08-16 DIAGNOSIS — D2261 Melanocytic nevi of right upper limb, including shoulder: Secondary | ICD-10-CM | POA: Diagnosis not present

## 2021-08-16 DIAGNOSIS — L700 Acne vulgaris: Secondary | ICD-10-CM | POA: Diagnosis not present

## 2021-10-27 DIAGNOSIS — J0100 Acute maxillary sinusitis, unspecified: Secondary | ICD-10-CM | POA: Diagnosis not present

## 2022-02-08 ENCOUNTER — Ambulatory Visit: Payer: BLUE CROSS/BLUE SHIELD | Admitting: Rheumatology

## 2022-02-16 NOTE — Progress Notes (Deleted)
Office Visit Note  Patient: Janet Hall             Date of Birth: May 16, 1985           MRN: [id number]             PCP: Oneita Hurt, No Referring: Waldon Merl, PA-C Visit Date: 03/01/2022 Occupation: @GUAROCC @  Subjective:  No chief complaint on file.   History of Present Illness: Janet Hall is a 36 y.o. female ***   Activities of Daily Living:  Patient reports morning stiffness for *** {minute/hour:19697}.   Patient {ACTIONS;DENIES/REPORTS:21021675::"Denies"} nocturnal pain.  Difficulty dressing/grooming: {ACTIONS;DENIES/REPORTS:21021675::"Denies"} Difficulty climbing stairs: {ACTIONS;DENIES/REPORTS:21021675::"Denies"} Difficulty getting out of chair: {ACTIONS;DENIES/REPORTS:21021675::"Denies"} Difficulty using hands for taps, buttons, cutlery, and/or writing: {ACTIONS;DENIES/REPORTS:21021675::"Denies"}  No Rheumatology ROS completed.   PMFS History:  Patient Active Problem List   Diagnosis Date Noted   Left breast mass 07/13/2020   Family history of breast cancer in mother 01/13/2014   Asthma, mild persistent    History of iron deficiency anemia 06/19/2012   GAD (generalized anxiety disorder) 11/21/2011   Migraine headache without aura 11/02/2011    Past Medical History:  Diagnosis Date   Asthma, mild persistent    Family history of breast cancer in mother    Per oncology, pt should start mammograms at age 18.   GAD (generalized anxiety disorder) 11/21/2011   Migraine without aura    Onset in early teens.  Anywhere from 2 per week to 1 per month.      Family History  Problem Relation Age of Onset   Arthritis Mother    Cancer Mother 21       breast.  Died of metastatic breast cancer 2017.   Hypertension Father    Arthritis Maternal Grandmother        liver cancer   Arthritis Maternal Grandfather        stomach cancer   Diabetes Paternal Grandmother    Heart attack Paternal Grandmother    Other Paternal Grandfather        vertigo   Past Surgical  History:  Procedure Laterality Date   NO PAST SURGERIES     Social History   Social History Narrative   Married, 1 child.   Educ: HS at 2018.   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   No T/A/Ds.   Intermittent exercising.   Her mom passed away from metastatic breast cancer, summer 2017.   Immunization History  Administered Date(s) Administered   Tdap 08/11/2014     Objective: Vital Signs: There were no vitals taken for this visit.   Physical Exam   Musculoskeletal Exam: ***  CDAI Exam: CDAI Score: -- Patient Global: --; Provider Global: -- Swollen: --; Tender: -- Joint Exam 03/01/2022   No joint exam has been documented for this visit   There is currently no information documented on the homunculus. Go to the Rheumatology activity and complete the homunculus joint exam.  Investigation: No additional findings.  Imaging: No results found.  Recent Labs: Lab Results  Component Value Date   WBC 16.0 (H) 08/11/2014   HGB 11.7 (L) 08/11/2014   PLT 202 08/11/2014   NA 137 06/19/2013   K 3.7 06/19/2013   CL 104 06/19/2013   CO2 28 06/19/2013   GLUCOSE 74 06/19/2013   BUN 9 06/19/2013   CREATININE 0.8 06/19/2013   BILITOT 1.1 06/19/2013   ALKPHOS 39 06/19/2013   AST 18 06/19/2013   ALT 14 06/19/2013   PROT  6.3 06/19/2013   ALBUMIN 4.2 06/19/2013   CALCIUM 8.9 06/19/2013     Speciality Comments: No specialty comments available.  Procedures:  No procedures performed Allergies: Patient has no known allergies.   Assessment / Plan:     Visit Diagnoses: No diagnosis found.  Orders: No orders of the defined types were placed in this encounter.  No orders of the defined types were placed in this encounter.   Face-to-face time spent with patient was *** minutes. Greater than 50% of time was spent in counseling and coordination of care.  Follow-Up Instructions: No follow-ups on file.   Pollyann Savoy, MD  Note - This record has  been created using Animal nutritionist.  Chart creation errors have been sought, but may not always  have been located. Such creation errors do not reflect on  the standard of medical care.
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
## 2022-02-21 ENCOUNTER — Ambulatory Visit: Payer: BC Managed Care – PPO | Attending: Rheumatology | Admitting: Rheumatology

## 2022-02-21 ENCOUNTER — Encounter: Payer: Self-pay | Admitting: Rheumatology

## 2022-02-21 VITALS — BP 125/87 | HR 118 | Resp 14 | Ht 69.0 in | Wt 129.6 lb

## 2022-02-21 DIAGNOSIS — Z803 Family history of malignant neoplasm of breast: Secondary | ICD-10-CM

## 2022-02-21 DIAGNOSIS — M542 Cervicalgia: Secondary | ICD-10-CM

## 2022-02-21 DIAGNOSIS — Z8669 Personal history of other diseases of the nervous system and sense organs: Secondary | ICD-10-CM

## 2022-02-21 DIAGNOSIS — T691XXA Chilblains, initial encounter: Secondary | ICD-10-CM

## 2022-02-21 DIAGNOSIS — R5383 Other fatigue: Secondary | ICD-10-CM

## 2022-02-21 DIAGNOSIS — R768 Other specified abnormal immunological findings in serum: Secondary | ICD-10-CM

## 2022-02-21 DIAGNOSIS — I7300 Raynaud's syndrome without gangrene: Secondary | ICD-10-CM | POA: Diagnosis not present

## 2022-02-21 DIAGNOSIS — F411 Generalized anxiety disorder: Secondary | ICD-10-CM | POA: Diagnosis not present

## 2022-02-21 DIAGNOSIS — Z8709 Personal history of other diseases of the respiratory system: Secondary | ICD-10-CM

## 2022-02-21 DIAGNOSIS — Z862 Personal history of diseases of the blood and blood-forming organs and certain disorders involving the immune mechanism: Secondary | ICD-10-CM

## 2022-02-21 MED ORDER — AMLODIPINE BESYLATE 5 MG PO TABS: 5.0000 mg | ORAL_TABLET | Freq: Every day | ORAL | 2 refills | Status: DC

## 2022-02-21 NOTE — Patient Instructions (Addendum)
You would start on amlodipine 5 mg tablet, half tablet p.o. nightly.  Please monitor your blood pressure.  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   Raynaud's Phenomenon  Raynaud's phenomenon is a condition that affects the blood vessels (arteries) that carry blood to the fingers and toes. The arteries that supply blood to the ears, lips, nipples, or the tip of the nose might also be affected. Raynaud's phenomenon causes the arteries to become narrow temporarily (spasm). As a result, the flow of blood to the affected areas is temporarily decreased. This usually occurs in response to cold temperatures or stress. During an attack, the skin in the affected areas turns white, then blue, and finally red. A person may also feel tingling or numbness in those areas. Attacks usually last for only a brief period, and then the blood flow to the area returns to normal. In most cases, Raynaud's phenomenon does not cause serious health problems. What are the causes? In many cases, the cause of this condition is not known. The condition may occur on its own (primary Raynaud's phenomenon) or may be associated with other diseases or factors (secondary Raynaud's phenomenon). Possible causes may include: Diseases or medical conditions that damage the arteries. Injuries and repetitive actions that hurt the hands or feet. Being exposed to certain chemicals. Taking medicines that narrow the arteries. Other medical conditions, such as lupus, scleroderma, rheumatoid arthritis, thyroid problems, blood disorders, Sjogren syndrome, or atherosclerosis. What increases the risk? The following factors may make you more likely to develop this condition: Being 56-70 years old. Being female. Having a family history of Raynaud's phenomenon. Living in a cold climate. Smoking. What are the signs or  symptoms? Symptoms of this condition usually occur when you are exposed to cold temperatures or when you have emotional stress. The symptoms may last for a few minutes or up to several hours. They usually affect your fingers but may also affect your toes, nipples, lips, ears, or the tip of your nose. Symptoms may include: Changes in skin color. The skin in the affected areas will turn pale or white. The skin may then change from white to bluish to red as normal blood flow returns to the area. Numbness, tingling, or pain in the affected areas. In severe cases, symptoms may include: Skin sores. Tissues decaying and dying (gangrene). How is this diagnosed? This condition may be diagnosed based on: Your symptoms and medical history. A physical exam. During the exam, you may be asked to put your hands in cold water to check for a reaction to cold temperature. Tests, such as: Blood tests to check for other diseases or conditions. A test to check the movement of blood through your arteries and veins (vascular ultrasound). A test in which the skin at the base of your fingernail is examined under a microscope (nailfold capillaroscopy). How is this treated? During an episode, you can take actions to help symptoms go away faster. Options include moving your arms around in a windmill pattern, warming your fingers under warm water, or placing your fingers in a warm body fold, such as your armpit. Long-term treatment for this condition often involves making lifestyle changes and taking steps to control your exposure to cold temperature. For more severe cases, medicine (calcium channel blockers) may be used to improve blood circulation. Follow these instructions at home: Avoiding cold temperatures Take these  steps to avoid exposure to cold: If possible, stay indoors during cold weather. When you go outside during cold weather, dress in layers and wear mittens, a hat, a scarf, and warm footwear. Wear mittens  or gloves when handling ice or frozen food. Use holders for glasses or cans containing cold drinks. Let warm water run for a while before taking a shower or bath. Warm up the car before driving in cold weather. Lifestyle If possible, avoid stressful and emotional situations. Try to find ways to manage your stress, such as: Exercise. Yoga. Meditation. Biofeedback. Do not use any products that contain nicotine or tobacco. These products include cigarettes, chewing tobacco, and vaping devices, such as e-cigarettes. If you need help quitting, ask your health care provider. Avoid secondhand smoke. Limit your use of caffeine. Switch to decaffeinated coffee, tea, and soda. Avoid chocolate. Avoid vibrating tools and machinery. General instructions Protect your hands and feet from injuries, cuts, or bruises. Avoid wearing tight rings or wristbands. Wear loose fitting socks and comfortable, roomy shoes. Take over-the-counter and prescription medicines only as told by your health care provider. Where to find support Raynaud's Association: www.raynauds.org Where to find more information [employer] of Arthritis and Musculoskeletal and Skin Diseases: www.niams.SouthExposed.es Contact a health care provider if: Your discomfort becomes worse despite lifestyle changes. You develop sores on your fingers or toes that do not heal. You have breaks in the skin on your fingers or toes. You have a fever. You have pain or swelling in your joints. You have a rash. Your symptoms occur on only one side of your body. Get help right away if: Your fingers or toes turn black. You have severe pain in the affected areas. These symptoms may represent a serious problem that is an emergency. Do not wait to see if the symptoms will go away. Get medical help right away. Call your local emergency services (911 in the [place] Do not drive yourself to the hospital. Summary Raynaud's phenomenon is a condition that affects  the arteries that carry blood to the fingers, toes, ears, lips, nipples, or the tip of the nose. In many cases, the cause of this condition is not known. Symptoms of this condition include changes in skin color along with numbness and tingling in the affected area. Treatment for this condition includes lifestyle changes and reducing exposure to cold temperatures. Medicines may be used for severe cases of the condition. Contact your health care provider if your condition worsens despite treatment. This information is not intended to replace advice given to you by your health care provider. Make sure you discuss any questions you have with your health care provider. Document Revised: 05/18/2020 Document Reviewed: 05/18/2020 Elsevier Patient Education  [place].  Amlodipine Tablets What is this medication? AMLODIPINE (am LOE di peen) treats high blood pressure and prevents chest pain (angina). It works by relaxing the blood vessels, which helps decrease the amount of work your heart has to do. It belongs to a group of medications called calcium channel blockers. This medicine may be used for other purposes; ask your health care provider or pharmacist if you have questions. COMMON BRAND NAME(S): Norvasc What should I tell my care team before I take this medication? They need to know if you have any of these conditions: Heart disease Liver disease An unusual or allergic reaction to amlodipine, other medications, foods, dyes, or preservatives Pregnant or trying to get pregnant Breastfeeding How should I use this medication? Take this medication by mouth.  Take it as directed on the prescription label at the same time every day. You can take it with or without food. If it upsets your stomach, take it with food. Keep taking it unless your care team tells you to stop. Talk to your care team about the use of this medication in children. While it may be prescribed for children as young as 6 for  selected conditions, precautions do apply. Overdosage: If you think you have taken too much of this medicine contact a poison control center or emergency room at once. NOTE: This medicine is only for you. Do not share this medicine with others. What if I miss a dose? If you miss a dose, take it as soon as you can. If it is almost time for your next dose, take only that dose. Do not take double or extra doses. What may interact with this medication? Clarithromycin Cyclosporine Diltiazem Itraconazole Simvastatin Tacrolimus This list may not describe all possible interactions. Give your health care provider a list of all the medicines, herbs, non-prescription drugs, or dietary supplements you use. Also tell them if you smoke, drink alcohol, or use illegal drugs. Some items may interact with your medicine. What should I watch for while using this medication? Visit your care team for regular checks on your progress. Check your blood pressure as directed. Know what your blood pressure should be and when to contact your care team. Do not treat yourself for coughs, colds, or pain while you are using this medication without asking your care team for advice. Some medications may increase your blood pressure. This medication may affect your coordination, reaction time, or judgment. Do not drive or operate machinery until you know how this medication affects you. Sit up or stand slowly to reduce the risk of dizzy or fainting spells. Drinking alcohol with this medication can increase the risk of these side effects. What side effects may I notice from receiving this medication? Side effects that you should report to your care team as soon as possible: Allergic reactions--skin rash, itching, hives, swelling of the face, lips, tongue, or throat Heart attack--pain or tightness in the chest, shoulders, arms, or jaw, nausea, shortness of breath, cold or clammy skin, feeling faint or lightheaded Low blood  pressure--dizziness, feeling faint or lightheaded, blurry vision Worsening chest pain (angina)--pain, pressure, or tightness in the chest, neck, back, or arms Side effects that usually do not require medical attention (report these to your care team if they continue or are bothersome): Facial flushing, redness Heart palpitations--rapid, pounding, or irregular heartbeat Nausea Stomach pain Swelling of the ankles, hands, or feet This list may not describe all possible side effects. Call your doctor for medical advice about side effects. You may report side effects to FDA at 1-800-FDA-1088. Where should I keep my medication? Keep out of the reach of children and pets. Store at room temperature between 20 and 25 degrees C (68 and 77 degrees F). Protect from light and moisture. Keep the container tightly closed. Get rid of any unused medication after the expiration date. To get rid of medications that are no longer needed or have expired: Take the medication to a medication take-back program. Check with your pharmacy or law enforcement to find a location. If you cannot return the medication, check the label or package insert to see if the medication should be thrown out in the garbage or flushed down the toilet. If you are not sure, ask your care team. If it is safe to  put in the trash, empty the medication out of the container. Mix the medication with cat litter, dirt, coffee grounds, or other unwanted substance. Seal the mixture in a bag or container. Put it in the trash. NOTE: This sheet is a summary. It may not cover all possible information. If you have questions about this medicine, talk to your doctor, pharmacist, or health care provider.  2023 Elsevier/Gold Standard (2021-10-03 00:00:00)

## 2022-02-27 LAB — CBC WITH DIFFERENTIAL/PLATELET
Absolute Monocytes: 347 cells/uL (ref 200–950)
Basophils Absolute: 51 cells/uL (ref 0–200)
Basophils Relative: 1 %
Eosinophils Absolute: 71 cells/uL (ref 15–500)
Eosinophils Relative: 1.4 %
HCT: 41.3 % (ref 35.0–45.0)
Hemoglobin: 13.8 g/dL (ref 11.7–15.5)
Lymphs Abs: 1066 cells/uL (ref 850–3900)
MCH: 29.9 pg (ref 27.0–33.0)
MCHC: 33.4 g/dL (ref 32.0–36.0)
MCV: 89.4 fL (ref 80.0–100.0)
MPV: 11 fL (ref 7.5–12.5)
Monocytes Relative: 6.8 %
Neutro Abs: 3565 cells/uL (ref 1500–7800)
Neutrophils Relative %: 69.9 %
Platelets: 256 10*3/uL (ref 140–400)
RBC: 4.62 10*6/uL (ref 3.80–5.10)
RDW: 12 % (ref 11.0–15.0)
Total Lymphocyte: 20.9 %
WBC: 5.1 10*3/uL (ref 3.8–10.8)

## 2022-02-27 LAB — COMPLETE METABOLIC PANEL WITH GFR
AG Ratio: 1.9 (calc) (ref 1.0–2.5)
ALT: 9 U/L (ref 6–29)
AST: 17 U/L (ref 10–30)
Albumin: 4.6 g/dL (ref 3.6–5.1)
Alkaline phosphatase (APISO): 42 U/L (ref 31–125)
BUN: 10 mg/dL (ref 7–25)
CO2: 25 mmol/L (ref 20–32)
Calcium: 9.2 mg/dL (ref 8.6–10.2)
Chloride: 106 mmol/L (ref 98–110)
Creat: 0.91 mg/dL (ref 0.50–0.97)
Globulin: 2.4 g/dL (calc) (ref 1.9–3.7)
Glucose, Bld: 86 mg/dL (ref 65–139)
Potassium: 4 mmol/L (ref 3.5–5.3)
Sodium: 140 mmol/L (ref 135–146)
Total Bilirubin: 1 mg/dL (ref 0.2–1.2)
Total Protein: 7 g/dL (ref 6.1–8.1)
eGFR: 84 mL/min/{1.73_m2} (ref >=60)

## 2022-02-27 LAB — ANA: Anti Nuclear Antibody (ANA): POSITIVE — AB

## 2022-02-27 LAB — CARDIOLIPIN ANTIBODIES, IGG, IGM, IGA
Anticardiolipin IgA: <2 APL-U/mL (ref <20.0)
Anticardiolipin IgG: <2 GPL-U/mL (ref <20.0)
Anticardiolipin IgM: <2 MPL-U/mL (ref <20.0)

## 2022-02-27 LAB — BETA-2 GLYCOPROTEIN ANTIBODIES
Beta-2 Glyco 1 IgA: <2 U/mL (ref <20.0)
Beta-2 Glyco 1 IgM: <2 U/mL (ref <20.0)
Beta-2 Glyco I IgG: <2 U/mL (ref <20.0)

## 2022-02-27 LAB — PAN-ANCA
ANCA SCREEN: NEGATIVE
Myeloperoxidase Abs: <1 AI (ref <1.0)
Serine Protease 3: <1 AI (ref <1.0)

## 2022-02-27 LAB — LUPUS ANTICOAGULANT EVAL W/ REFLEX
PTT-LA Screen: 40 s (ref <=40)
dRVVT: 31 s (ref <=45)

## 2022-02-27 LAB — ANTI-NUCLEAR AB-TITER (ANA TITER): ANA Titer 1: 1:80 {titer} — ABNORMAL HIGH

## 2022-02-27 LAB — ANTI-SCLERODERMA ANTIBODY: Scleroderma (Scl-70) (ENA) Antibody, IgG: <1 AI

## 2022-02-27 LAB — RNP ANTIBODY: Ribonucleic Protein(ENA) Antibody, IgG: <1 AI

## 2022-02-27 LAB — C3 AND C4
C3 Complement: 95 mg/dL (ref 83–193)
C4 Complement: 16 mg/dL (ref 15–57)

## 2022-02-27 LAB — ANTI-SMITH ANTIBODY: ENA SM Ab Ser-aCnc: <1 AI

## 2022-02-27 LAB — CRYOGLOBULIN: Cryoglobulin, Qualitative Analysis: NOT DETECTED

## 2022-02-27 LAB — SJOGRENS SYNDROME-B EXTRACTABLE NUCLEAR ANTIBODY: SSB (La) (ENA) Antibody, IgG: <1 AI

## 2022-02-27 LAB — SJOGRENS SYNDROME-A EXTRACTABLE NUCLEAR ANTIBODY: SSA (Ro) (ENA) Antibody, IgG: <1 AI

## 2022-02-27 LAB — ANTI-DNA ANTIBODY, DOUBLE-STRANDED: ds DNA Ab: <1 IU/mL

## 2022-02-27 LAB — CK: Total CK: 55 U/L (ref 29–143)

## 2022-02-27 LAB — SEDIMENTATION RATE: Sed Rate: 2 mm/h (ref 0–20)

## 2022-03-01 ENCOUNTER — Ambulatory Visit: Payer: Medicaid Other | Admitting: Rheumatology

## 2022-03-01 DIAGNOSIS — Z8709 Personal history of other diseases of the respiratory system: Secondary | ICD-10-CM

## 2022-03-01 DIAGNOSIS — Z862 Personal history of diseases of the blood and blood-forming organs and certain disorders involving the immune mechanism: Secondary | ICD-10-CM

## 2022-03-01 DIAGNOSIS — F411 Generalized anxiety disorder: Secondary | ICD-10-CM

## 2022-03-01 DIAGNOSIS — Z8669 Personal history of other diseases of the nervous system and sense organs: Secondary | ICD-10-CM

## 2022-03-01 DIAGNOSIS — R768 Other specified abnormal immunological findings in serum: Secondary | ICD-10-CM

## 2022-03-27 NOTE — Progress Notes (Unsigned)
Office Visit Note  Patient: Janet Hall             Date of Birth: 1985-04-29           MRN: [id number]             PCP: Gillis Ends Referring: Brunetta Jeans, PA-C Visit Date: 03/29/2022 Occupation: _0 @  Subjective:  No chief complaint on file.   History of Present Illness: Janet Hall is a 37 y.o. female ***     Activities of Daily Living:  Patient reports morning stiffness for *** {minute/hour:19697}.   Patient {ACTIONS;DENIES/REPORTS:21021675::"Denies"} nocturnal pain.  Difficulty dressing/grooming: {ACTIONS;DENIES/REPORTS:21021675::"Denies"} Difficulty climbing stairs: {ACTIONS;DENIES/REPORTS:21021675::"Denies"} Difficulty getting out of chair: {ACTIONS;DENIES/REPORTS:21021675::"Denies"} Difficulty using hands for taps, buttons, cutlery, and/or writing: {ACTIONS;DENIES/REPORTS:21021675::"Denies"}  No Rheumatology ROS completed.   PMFS History:  Patient Active Problem List   Diagnosis Date Noted   Left breast mass 07/13/2020   Family history of breast cancer in mother 01/13/2014   Asthma, mild persistent    History of iron deficiency anemia 06/19/2012   GAD (generalized anxiety disorder) 11/21/2011   Migraine headache without aura 11/02/2011    Past Medical History:  Diagnosis Date   Asthma, mild persistent    Family history of breast cancer in mother    Per oncology, pt should start mammograms at age 39.   GAD (generalized anxiety disorder) 11/21/2011   Migraine without aura    Onset in early teens.  Anywhere from 2 per week to 1 per month.      Family History  Problem Relation Age of Onset   Cancer Mother 86       breast.  Died of metastatic breast cancer 2017.   Hypertension Father    Prostate cancer Father    High Cholesterol Father    Thyroid disease Sister    Healthy Sister    Arthritis Maternal Grandmother        liver cancer   Arthritis Maternal Grandfather        stomach cancer   Diabetes Paternal Grandmother     Heart attack Paternal Grandmother    Other Paternal Grandfather        vertigo   Healthy Daughter    Past Surgical History:  Procedure Laterality Date   NO PAST SURGERIES     WISDOM TOOTH EXTRACTION     Social History   Social History Narrative   Married, 1 child.   Educ: HS at [employer].   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   No T/A/Ds.   Intermittent exercising.   Her mom passed away from metastatic breast cancer, summer 2017.   Immunization History  Administered Date(s) Administered   Tdap 08/11/2014     Objective: Vital Signs: There were no vitals taken for this visit.   Physical Exam   Musculoskeletal Exam: ***  CDAI Exam: CDAI Score: -- Patient Global: --; Provider Global: -- Swollen: --; Tender: -- Joint Exam 03/29/2022   No joint exam has been documented for this visit   There is currently no information documented on the homunculus. Go to the Rheumatology activity and complete the homunculus joint exam.  Investigation: No additional findings.  Imaging: No results found.  Recent Labs: Lab Results  Component Value Date   WBC 5.1 02/21/2022   HGB 13.8 02/21/2022   PLT 256 02/21/2022   NA 140 02/21/2022   K 4.0 02/21/2022   CL 106 02/21/2022   CO2 25 02/21/2022   GLUCOSE 86 02/21/2022  BUN 10 02/21/2022   CREATININE 0.91 02/21/2022   BILITOT 1.0 02/21/2022   ALKPHOS 39 06/19/2013   AST 17 02/21/2022   ALT 9 02/21/2022   PROT 7.0 02/21/2022   ALBUMIN 4.2 06/19/2013   CALCIUM 9.2 02/21/2022   February 21, 2022 ANA 1: 80NH, ENA negative, C3-C4 normal, beta-2 GP 1 negative, anticardiolipin negative, lupus anticoagulant negative, ANCA negative, MPO negative, serum proteinase 3 negative, cryoglobulins negative, CK 55, ESR 2  06/06/21:ANA 1:80NH, 1:40NS   Speciality Comments: No specialty comments available.  Procedures:  No procedures performed Allergies: Doxycycline   Assessment / Plan:     Visit Diagnoses: No diagnosis  found.  Orders: No orders of the defined types were placed in this encounter.  No orders of the defined types were placed in this encounter.   Face-to-face time spent with patient was *** minutes. Greater than 50% of time was spent in counseling and coordination of care.  Follow-Up Instructions: No follow-ups on file.   Bo Merino, MD  Note - This record has been created using Editor, commissioning.  Chart creation errors have been sought, but may not always  have been located. Such creation errors do not reflect on  the standard of medical care.

## 2022-03-29 ENCOUNTER — Ambulatory Visit: Payer: BC Managed Care – PPO | Attending: Rheumatology | Admitting: Rheumatology

## 2022-03-29 ENCOUNTER — Encounter: Payer: Self-pay | Admitting: Rheumatology

## 2022-03-29 VITALS — BP 135/85 | HR 111 | Resp 14 | Ht 69.0 in | Wt 129.0 lb

## 2022-03-29 DIAGNOSIS — R768 Other specified abnormal immunological findings in serum: Secondary | ICD-10-CM

## 2022-03-29 DIAGNOSIS — F411 Generalized anxiety disorder: Secondary | ICD-10-CM

## 2022-03-29 DIAGNOSIS — M542 Cervicalgia: Secondary | ICD-10-CM

## 2022-03-29 DIAGNOSIS — Z862 Personal history of diseases of the blood and blood-forming organs and certain disorders involving the immune mechanism: Secondary | ICD-10-CM

## 2022-03-29 DIAGNOSIS — I7300 Raynaud's syndrome without gangrene: Secondary | ICD-10-CM

## 2022-03-29 DIAGNOSIS — Z8669 Personal history of other diseases of the nervous system and sense organs: Secondary | ICD-10-CM

## 2022-03-29 DIAGNOSIS — Z8709 Personal history of other diseases of the respiratory system: Secondary | ICD-10-CM

## 2022-03-29 DIAGNOSIS — R5383 Other fatigue: Secondary | ICD-10-CM

## 2022-03-29 DIAGNOSIS — R7689 Other specified abnormal immunological findings in serum: Secondary | ICD-10-CM

## 2022-03-29 DIAGNOSIS — Z803 Family history of malignant neoplasm of breast: Secondary | ICD-10-CM

## 2022-04-18 DIAGNOSIS — D2261 Melanocytic nevi of right upper limb, including shoulder: Secondary | ICD-10-CM | POA: Diagnosis not present

## 2022-04-18 DIAGNOSIS — D2262 Melanocytic nevi of left upper limb, including shoulder: Secondary | ICD-10-CM | POA: Diagnosis not present

## 2022-04-18 DIAGNOSIS — D225 Melanocytic nevi of trunk: Secondary | ICD-10-CM | POA: Diagnosis not present

## 2022-04-18 DIAGNOSIS — L738 Other specified follicular disorders: Secondary | ICD-10-CM | POA: Diagnosis not present

## 2022-05-09 DIAGNOSIS — J4520 Mild intermittent asthma, uncomplicated: Secondary | ICD-10-CM | POA: Diagnosis not present
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
## 2022-10-04 ENCOUNTER — Ambulatory Visit: Payer: BC Managed Care – PPO | Attending: Rheumatology | Admitting: Rheumatology

## 2022-10-04 ENCOUNTER — Encounter: Payer: Self-pay | Admitting: Rheumatology

## 2022-10-04 VITALS — BP 122/84 | HR 108 | Resp 15 | Ht 68.0 in | Wt 125.0 lb

## 2022-10-04 DIAGNOSIS — R768 Other specified abnormal immunological findings in serum: Secondary | ICD-10-CM | POA: Diagnosis not present

## 2022-10-04 DIAGNOSIS — Z8669 Personal history of other diseases of the nervous system and sense organs: Secondary | ICD-10-CM

## 2022-10-04 DIAGNOSIS — R5383 Other fatigue: Secondary | ICD-10-CM | POA: Diagnosis not present

## 2022-10-04 DIAGNOSIS — M542 Cervicalgia: Secondary | ICD-10-CM

## 2022-10-04 DIAGNOSIS — F411 Generalized anxiety disorder: Secondary | ICD-10-CM

## 2022-10-04 DIAGNOSIS — Z862 Personal history of diseases of the blood and blood-forming organs and certain disorders involving the immune mechanism: Secondary | ICD-10-CM

## 2022-10-04 DIAGNOSIS — Z8709 Personal history of other diseases of the respiratory system: Secondary | ICD-10-CM

## 2022-10-04 DIAGNOSIS — I7300 Raynaud's syndrome without gangrene: Secondary | ICD-10-CM

## 2022-10-04 DIAGNOSIS — Z803 Family history of malignant neoplasm of breast: Secondary | ICD-10-CM

## 2022-10-24 ENCOUNTER — Encounter: Payer: Self-pay | Admitting: Women's Health

## 2022-10-24 ENCOUNTER — Ambulatory Visit (INDEPENDENT_AMBULATORY_CARE_PROVIDER_SITE_OTHER): Payer: BC Managed Care – PPO | Admitting: Women's Health

## 2022-10-24 VITALS — BP 132/88 | HR 93 | Ht 69.0 in | Wt 125.0 lb

## 2022-10-24 DIAGNOSIS — Z01419 Encounter for gynecological examination (general) (routine) without abnormal findings: Secondary | ICD-10-CM | POA: Diagnosis not present

## 2022-10-24 NOTE — Progress Notes (Signed)
WELL-WOMAN EXAMINATION Patient name: Janet Hall MRN [id number]  Date of birth: 1985/10/28 Chief Complaint:   Gynecologic Exam  History of Present Illness:   Janet Hall is a 37 y.o. G14P1001 Caucasian female being seen today for a routine well-woman exam.  Current complaints: known Lt breast mass comes and goes w/ periods.   PCP: [place]      does not desire labs Patient's last menstrual period was 10/14/2022. The current method of family planning is condoms.  Last pap 07/13/20. Results were: NILM w/ HRHPV negative. H/O abnormal pap: no Last mammogram: last year for Lt breast mass, was normal w/ [facility].  Family h/o breast cancer: yes mom dx @ 66yo, mom's niece (pt's cousin) dx @ 53yo, no genetic testing. Pt declines today.  Last colonoscopy: never. Results were: N/A. Family h/o colorectal cancer: no     10/24/2022    2:01 PM 07/13/2020    1:48 PM 04/29/2019    9:02 AM 12/03/2017    8:20 AM 01/04/2017    1:36 PM  Depression screen PHQ 2/9  Decreased Interest 0 0 0 0 0  Down, Depressed, Hopeless 0 0 0 0 0  PHQ - 2 Score 0 0 0 0 0  Altered sleeping 0 0  0   Tired, decreased energy 0 0  0   Change in appetite 0 0  0   Feeling bad or failure about yourself  0 0  0   Trouble concentrating 0 0  0   Moving slowly or fidgety/restless 0 0  0   Suicidal thoughts 0 0  0   PHQ-9 Score 0 0  0   Difficult doing work/chores    Not difficult at all         10/24/2022    2:01 PM 07/13/2020    1:48 PM  GAD 7 : Generalized Anxiety Score  Nervous, Anxious, on Edge 0 0  Control/stop worrying 0 0  Worry too much - different things 0 0  Trouble relaxing 0 0  Restless 0 0  Easily annoyed or irritable 0 0  Afraid - awful might happen 0 0  Total GAD 7 Score 0 0     Review of Systems:   Pertinent items are noted in HPI Denies any headaches, blurred vision, fatigue, shortness of breath, chest pain, abdominal pain, abnormal vaginal discharge/itching/odor/irritation, problems with periods,  bowel movements, urination, or intercourse unless otherwise stated above. Pertinent History Reviewed:  Reviewed past medical,surgical, social and family history.  Reviewed problem list, medications and allergies. Physical Assessment:   Vitals:   10/24/22 1350 10/24/22 1420  BP: (!) 138/90 132/88  Pulse: (!) 119 93  Weight: 125 lb (56.7 kg)   Height: 5\' 9"  (1.753 m)   Body mass index is 18.46 kg/m.        Physical Examination:   General appearance - well appearing, and in no distress  Mental status - alert, oriented to person, place, and time  Psych:  She has a normal mood and affect  Skin - warm and dry, normal color, no suspicious lesions noted  Chest - effort normal, all lung fields clear to auscultation bilaterally  Heart - normal rate and regular rhythm  Neck:  midline trachea, no thyromegaly or nodules  Breasts - Rt breast appears normal, no suspicious masses, no skin or nipple changes or  axillary nodes Lt: firm non-tender mass at 3 o'clock 34fb from nipple, no axillary nodes or skin changes (no change from last year)  Abdomen - soft, nontender, nondistended, no masses or organomegaly  Pelvic - VULVA: normal appearing vulva with no masses, tenderness or lesions  VAGINA: normal appearing vagina with normal color and discharge, no lesions  CERVIX: normal appearing cervix without discharge or lesions, no CMT  Thin prep pap is not done   UTERUS: uterus is felt to be normal size, shape, consistency and nontender   ADNEXA: No adnexal masses or tenderness noted.  Extremities:  No swelling or varicosities noted  Chaperone:  Sherri Kaywood     No results found for this or any previous visit (from the past 24 hour(s)).  Assessment & Plan:  1) Well-Woman Exam  2) Known Lt breast mass> normal u/s and mammogram w/ [facility] last year, stable today  3) Elevated bp> normal on repeat  Labs/procedures today: exam  Mammogram: @ 36yo, or sooner if problems Colonoscopy: @ 36yo, or sooner if  problems  No orders of the defined types were placed in this encounter.   Meds: No orders of the defined types were placed in this encounter.   Follow-up: Return in about 1 year (around 10/24/2023) for Pap & physical.  Cheral Marker CNM, [facility] 10/24/2022 2:29 PM

## 2022-11-07 DIAGNOSIS — D2262 Melanocytic nevi of left upper limb, including shoulder: Secondary | ICD-10-CM | POA: Diagnosis not present

## 2022-11-07 DIAGNOSIS — D225 Melanocytic nevi of trunk: Secondary | ICD-10-CM | POA: Diagnosis not present

## 2022-11-07 DIAGNOSIS — D2271 Melanocytic nevi of right lower limb, including hip: Secondary | ICD-10-CM | POA: Diagnosis not present

## 2022-11-07 DIAGNOSIS — D2261 Melanocytic nevi of right upper limb, including shoulder: Secondary | ICD-10-CM | POA: Diagnosis not present

## 2022-11-17 DIAGNOSIS — K602 Anal fissure, unspecified: Secondary | ICD-10-CM | POA: Diagnosis not present

## 2023-01-04 ENCOUNTER — Other Ambulatory Visit: Payer: Self-pay | Admitting: Internal Medicine

## 2023-01-04 DIAGNOSIS — K602 Anal fissure, unspecified: Secondary | ICD-10-CM | POA: Diagnosis not present

## 2023-01-04 DIAGNOSIS — K625 Hemorrhage of anus and rectum: Secondary | ICD-10-CM | POA: Diagnosis not present

## 2023-01-04 DIAGNOSIS — R1011 Right upper quadrant pain: Secondary | ICD-10-CM

## 2023-01-12 ENCOUNTER — Ambulatory Visit
Admission: RE | Admit: 2023-01-12 | Discharge: 2023-01-12 | Disposition: A | Discharge status: Home / Self Care | Payer: BC Managed Care – PPO | Source: Ambulatory Visit | Attending: Internal Medicine

## 2023-01-12 DIAGNOSIS — R1011 Right upper quadrant pain: Secondary | ICD-10-CM | POA: Diagnosis not present

## 2023-01-26 HISTORY — PX: COLONOSCOPY: SHX174

## 2023-01-30 DIAGNOSIS — K625 Hemorrhage of anus and rectum: Secondary | ICD-10-CM | POA: Diagnosis not present

## 2023-01-30 DIAGNOSIS — K602 Anal fissure, unspecified: Secondary | ICD-10-CM | POA: Diagnosis not present

## 2023-03-27 NOTE — Progress Notes (Deleted)
 Office Visit Note  Patient: Janet Hall             Date of Birth: [birth year]           MRN: [id number]             PCP: Nena Cyndee LABOR, PA-C Referring: Nena Cyndee LABOR DEVONNA Visit Date: 04/10/2023 Occupation: @GUAROCC @  Subjective:  No chief complaint on file.   History of Present Illness: Janet Hall is a 37 y.o. female ***     Activities of Daily Living:  Patient reports morning stiffness for *** {minute/hour:19697}.   Patient {ACTIONS;DENIES/REPORTS:21021675::Denies} nocturnal pain.  Difficulty dressing/grooming: {ACTIONS;DENIES/REPORTS:21021675::Denies} Difficulty climbing stairs: {ACTIONS;DENIES/REPORTS:21021675::Denies} Difficulty getting out of chair: {ACTIONS;DENIES/REPORTS:21021675::Denies} Difficulty using hands for taps, buttons, cutlery, and/or writing: {ACTIONS;DENIES/REPORTS:21021675::Denies}  No Rheumatology ROS completed.   PMFS History:  Patient Active Problem List   Diagnosis Date Noted   Left breast mass 07/13/2020   Family history of breast cancer in mother 01/13/2014   Asthma, mild persistent    History of iron deficiency anemia 06/19/2012   GAD (generalized anxiety disorder) 11/21/2011   Migraine headache without aura 11/02/2011    Past Medical History:  Diagnosis Date   Asthma, mild persistent    Family history of breast cancer in mother    Per oncology, pt should start mammograms at age 64.   GAD (generalized anxiety disorder) 11/21/2011   Migraine without aura    Onset in early teens.  Anywhere from 2 per week to 1 per month.      Family History  Problem Relation Age of Onset   Cancer Mother 2       breast.  Died of metastatic breast cancer 2017.   Hypertension Father    Prostate cancer Father    High Cholesterol Father    Thyroid disease Sister    Healthy Sister    Arthritis Maternal Grandmother        liver cancer   Arthritis Maternal Grandfather        stomach cancer   Diabetes Paternal Grandmother     Heart attack Paternal Grandmother    Other Paternal Grandfather        vertigo   Healthy Daughter    Past Surgical History:  Procedure Laterality Date   NO PAST SURGERIES     WISDOM TOOTH EXTRACTION     Social History   Social History Narrative   Married, 1 child.   Educ: HS at [employer].   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   No T/A/Ds.   Intermittent exercising.   Her mom passed away from metastatic breast cancer, summer 2017.   Immunization History  Administered Date(s) Administered   Tdap 08/11/2014     Objective: Vital Signs: There were no vitals taken for this visit.   Physical Exam   Musculoskeletal Exam: ***  CDAI Exam: CDAI Score: -- Patient Global: --; Provider Global: -- Swollen: --; Tender: -- Joint Exam 04/10/2023   No joint exam has been documented for this visit   There is currently no information documented on the homunculus. Go to the Rheumatology activity and complete the homunculus joint exam.  Investigation: No additional findings.  Imaging: No results found.  Recent Labs: Lab Results  Component Value Date   WBC 5.1 02/21/2022   HGB 13.8 02/21/2022   PLT 256 02/21/2022   NA 140 02/21/2022   K 4.0 02/21/2022   CL 106 02/21/2022   CO2 25 02/21/2022   GLUCOSE 86 02/21/2022  BUN 10 02/21/2022   CREATININE 0.91 02/21/2022   BILITOT 1.0 02/21/2022   ALKPHOS 39 06/19/2013   AST 17 02/21/2022   ALT 9 02/21/2022   PROT 7.0 02/21/2022   ALBUMIN 4.2 06/19/2013   CALCIUM 9.2 02/21/2022   February 21, 2022 ANA 1: 80NH, ENA negative, C3-C4 normal, beta-2  GP 1 negative, anticardiolipin negative, lupus anticoagulant negative   Speciality Comments: No specialty comments available.  Procedures:  No procedures performed Allergies: Doxycycline    Assessment / Plan:     Visit Diagnoses: No diagnosis found.  Orders: No orders of the defined types were placed in this encounter.  No orders of the defined types were placed  in this encounter.   Face-to-face time spent with patient was *** minutes. Greater than 50% of time was spent in counseling and coordination of care.  Follow-Up Instructions: No follow-ups on file.   Maya Nash, MD  Note - This record has been created using Animal nutritionist.  Chart creation errors have been sought, but may not always  have been located. Such creation errors do not reflect on  the standard of medical care.

## 2023-04-10 ENCOUNTER — Ambulatory Visit: Payer: BC Managed Care – PPO | Admitting: Rheumatology

## 2023-04-10 DIAGNOSIS — I7300 Raynaud's syndrome without gangrene: Secondary | ICD-10-CM

## 2023-04-10 DIAGNOSIS — R5383 Other fatigue: Secondary | ICD-10-CM

## 2023-04-10 DIAGNOSIS — Z862 Personal history of diseases of the blood and blood-forming organs and certain disorders involving the immune mechanism: Secondary | ICD-10-CM

## 2023-04-10 DIAGNOSIS — M542 Cervicalgia: Secondary | ICD-10-CM

## 2023-04-10 DIAGNOSIS — Z8709 Personal history of other diseases of the respiratory system: Secondary | ICD-10-CM

## 2023-04-10 DIAGNOSIS — Z8669 Personal history of other diseases of the nervous system and sense organs: Secondary | ICD-10-CM

## 2023-04-10 DIAGNOSIS — Z803 Family history of malignant neoplasm of breast: Secondary | ICD-10-CM

## 2023-04-10 DIAGNOSIS — F411 Generalized anxiety disorder: Secondary | ICD-10-CM

## 2023-04-10 DIAGNOSIS — R768 Other specified abnormal immunological findings in serum: Secondary | ICD-10-CM

## 2023-05-08 DIAGNOSIS — J0100 Acute maxillary sinusitis, unspecified: Secondary | ICD-10-CM | POA: Diagnosis not present

## 2023-05-28 DIAGNOSIS — Z20828 Contact with and (suspected) exposure to other viral communicable diseases: Secondary | ICD-10-CM | POA: Diagnosis not present

## 2023-05-28 DIAGNOSIS — J0100 Acute maxillary sinusitis, unspecified: Secondary | ICD-10-CM | POA: Diagnosis not present
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
## 2023-06-14 ENCOUNTER — Other Ambulatory Visit: Payer: Self-pay

## 2023-06-14 DIAGNOSIS — R768 Other specified abnormal immunological findings in serum: Secondary | ICD-10-CM

## 2023-06-16 LAB — CBC WITH DIFFERENTIAL/PLATELET
Absolute Lymphocytes: 1547 {cells}/uL (ref 850–3900)
Absolute Monocytes: 449 {cells}/uL (ref 200–950)
Basophils Absolute: 52 {cells}/uL (ref 0–200)
Basophils Relative: 0.8 %
Eosinophils Absolute: 143 {cells}/uL (ref 15–500)
Eosinophils Relative: 2.2 %
HCT: 42.1 % (ref 35.0–45.0)
Hemoglobin: 13.7 g/dL (ref 11.7–15.5)
MCH: 29.3 pg (ref 27.0–33.0)
MCHC: 32.5 g/dL (ref 32.0–36.0)
MCV: 90 fL (ref 80.0–100.0)
MPV: 10.6 fL (ref 7.5–12.5)
Monocytes Relative: 6.9 %
Neutro Abs: 4310 {cells}/uL (ref 1500–7800)
Neutrophils Relative %: 66.3 %
Platelets: 268 10*3/uL (ref 140–400)
RBC: 4.68 10*6/uL (ref 3.80–5.10)
RDW: 12.5 % (ref 11.0–15.0)
Total Lymphocyte: 23.8 %
WBC: 6.5 10*3/uL (ref 3.8–10.8)

## 2023-06-16 LAB — COMPREHENSIVE METABOLIC PANEL
AG Ratio: 2.3 (calc) (ref 1.0–2.5)
ALT: 21 U/L (ref 6–29)
AST: 23 U/L (ref 10–30)
Albumin: 4.3 g/dL (ref 3.6–5.1)
Alkaline phosphatase (APISO): 46 U/L (ref 31–125)
BUN: 10 mg/dL (ref 7–25)
CO2: 28 mmol/L (ref 20–32)
Calcium: 9.3 mg/dL (ref 8.6–10.2)
Chloride: 104 mmol/L (ref 98–110)
Creat: 0.83 mg/dL (ref 0.50–0.97)
Globulin: 1.9 g/dL (ref 1.9–3.7)
Glucose, Bld: 81 mg/dL (ref 65–99)
Potassium: 3.9 mmol/L (ref 3.5–5.3)
Sodium: 140 mmol/L (ref 135–146)
Total Bilirubin: 0.8 mg/dL (ref 0.2–1.2)
Total Protein: 6.2 g/dL (ref 6.1–8.1)
eGFR: 94 mL/min/{1.73_m2} (ref >=60)

## 2023-06-16 LAB — C3 AND C4
C3 Complement: 94 mg/dL (ref 83–193)
C4 Complement: 13 mg/dL — ABNORMAL LOW (ref 15–57)

## 2023-06-16 LAB — PROTEIN / CREATININE RATIO, URINE
Creatinine, Urine: 199 mg/dL (ref 20–275)
Protein/Creat Ratio: 60 mg/g{creat} (ref 24–184)
Protein/Creatinine Ratio: 0.06 mg/mg{creat} (ref 0.024–0.184)
Total Protein, Urine: 12 mg/dL (ref 5–24)

## 2023-06-16 LAB — SEDIMENTATION RATE: Sed Rate: 2 mm/h (ref 0–20)

## 2023-06-16 LAB — ANTI-NUCLEAR AB-TITER (ANA TITER): ANA Titer 1: 1:80 {titer} — ABNORMAL HIGH

## 2023-06-16 LAB — ANTI-DNA ANTIBODY, DOUBLE-STRANDED: ds DNA Ab: <1 [IU]/mL

## 2023-06-16 LAB — ANA: Anti Nuclear Antibody (ANA): POSITIVE — AB

## 2023-06-17 NOTE — Progress Notes (Signed)
 CBC and CMP are normal.  Urine protein creatinine ratio normal, complement mildly decreased, ANA low titer positive and stable.  Sed rate normal, double-stranded DNA negative.  Labs do not indicate an autoimmune disease flare.  I will discuss results at the follow-up visit.

## 2023-06-19 ENCOUNTER — Encounter: Payer: Self-pay | Admitting: Rheumatology

## 2023-06-19 ENCOUNTER — Ambulatory Visit: Payer: Self-pay | Attending: Rheumatology | Admitting: Rheumatology

## 2023-06-19 VITALS — BP 132/83 | HR 99 | Resp 14 | Ht 69.0 in | Wt 129.0 lb

## 2023-06-19 DIAGNOSIS — R5383 Other fatigue: Secondary | ICD-10-CM

## 2023-06-19 DIAGNOSIS — M542 Cervicalgia: Secondary | ICD-10-CM | POA: Diagnosis not present

## 2023-06-19 DIAGNOSIS — Z8669 Personal history of other diseases of the nervous system and sense organs: Secondary | ICD-10-CM

## 2023-06-19 DIAGNOSIS — R768 Other specified abnormal immunological findings in serum: Secondary | ICD-10-CM

## 2023-06-19 DIAGNOSIS — Z862 Personal history of diseases of the blood and blood-forming organs and certain disorders involving the immune mechanism: Secondary | ICD-10-CM

## 2023-06-19 DIAGNOSIS — Z8709 Personal history of other diseases of the respiratory system: Secondary | ICD-10-CM

## 2023-06-19 DIAGNOSIS — F411 Generalized anxiety disorder: Secondary | ICD-10-CM

## 2023-06-19 DIAGNOSIS — I7300 Raynaud's syndrome without gangrene: Secondary | ICD-10-CM | POA: Diagnosis not present

## 2023-06-19 DIAGNOSIS — Z803 Family history of malignant neoplasm of breast: Secondary | ICD-10-CM

## 2023-09-03 DIAGNOSIS — J0140 Acute pansinusitis, unspecified: Secondary | ICD-10-CM | POA: Diagnosis not present

## 2023-09-06 DIAGNOSIS — J0140 Acute pansinusitis, unspecified: Secondary | ICD-10-CM | POA: Diagnosis not present

## 2024-02-25 DIAGNOSIS — D2271 Melanocytic nevi of right lower limb, including hip: Secondary | ICD-10-CM | POA: Diagnosis not present

## 2024-02-25 DIAGNOSIS — D225 Melanocytic nevi of trunk: Secondary | ICD-10-CM | POA: Diagnosis not present

## 2024-02-25 DIAGNOSIS — D2262 Melanocytic nevi of left upper limb, including shoulder: Secondary | ICD-10-CM | POA: Diagnosis not present

## 2024-02-25 DIAGNOSIS — D224 Melanocytic nevi of scalp and neck: Secondary | ICD-10-CM | POA: Diagnosis not present

## 2024-03-05 DIAGNOSIS — J40 Bronchitis, not specified as acute or chronic: Secondary | ICD-10-CM | POA: Diagnosis not present

## 2024-06-17 ENCOUNTER — Ambulatory Visit: Admitting: Rheumatology
